# Patient Record
Sex: Female | Born: 1955 | Race: Black or African American | Hispanic: No | Smoking: Former smoker
Health system: Southern US, Community
[De-identification: ages and names within clinical notes are randomized; demographics above are authoritative.]

## PROBLEM LIST (undated history)

## (undated) DIAGNOSIS — Z923 Personal history of irradiation: Secondary | ICD-10-CM

## (undated) DIAGNOSIS — I10 Essential (primary) hypertension: Secondary | ICD-10-CM

## (undated) DIAGNOSIS — M419 Scoliosis, unspecified: Secondary | ICD-10-CM

## (undated) DIAGNOSIS — K219 Gastro-esophageal reflux disease without esophagitis: Secondary | ICD-10-CM

## (undated) DIAGNOSIS — E785 Hyperlipidemia, unspecified: Secondary | ICD-10-CM

## (undated) DIAGNOSIS — C801 Malignant (primary) neoplasm, unspecified: Secondary | ICD-10-CM

## (undated) DIAGNOSIS — K449 Diaphragmatic hernia without obstruction or gangrene: Secondary | ICD-10-CM

## (undated) DIAGNOSIS — K635 Polyp of colon: Secondary | ICD-10-CM

## (undated) DIAGNOSIS — G2581 Restless legs syndrome: Secondary | ICD-10-CM

## (undated) DIAGNOSIS — C50919 Malignant neoplasm of unspecified site of unspecified female breast: Secondary | ICD-10-CM

## (undated) DIAGNOSIS — D649 Anemia, unspecified: Secondary | ICD-10-CM

## (undated) HISTORY — DX: Restless legs syndrome: G25.81

## (undated) HISTORY — DX: Gastro-esophageal reflux disease without esophagitis: K21.9

## (undated) HISTORY — PX: CHOLECYSTECTOMY: SHX55

## (undated) HISTORY — DX: Essential (primary) hypertension: I10

## (undated) HISTORY — PX: ABDOMINAL HYSTERECTOMY: SHX81

## (undated) HISTORY — DX: Malignant (primary) neoplasm, unspecified: C80.1

## (undated) HISTORY — DX: Hyperlipidemia, unspecified: E78.5

## (undated) HISTORY — PX: COLONOSCOPY: SHX174

## (undated) HISTORY — DX: Polyp of colon: K63.5

## (undated) HISTORY — DX: Diaphragmatic hernia without obstruction or gangrene: K44.9

## (undated) HISTORY — PX: TONSILLECTOMY: SUR1361

## (undated) HISTORY — DX: Malignant neoplasm of unspecified site of unspecified female breast: C50.919

## (undated) HISTORY — PX: TUBAL LIGATION: SHX77

## (undated) HISTORY — DX: Anemia, unspecified: D64.9

## (undated) HISTORY — PX: BACK SURGERY: SHX140

---

## 2004-08-20 ENCOUNTER — Inpatient Hospital Stay: Payer: Self-pay | Admitting: Unknown Physician Specialty

## 2007-03-09 ENCOUNTER — Ambulatory Visit: Payer: Self-pay | Admitting: Internal Medicine

## 2008-04-17 ENCOUNTER — Ambulatory Visit: Payer: Self-pay | Admitting: Internal Medicine

## 2009-04-24 ENCOUNTER — Ambulatory Visit: Payer: Self-pay | Admitting: Internal Medicine

## 2010-05-21 ENCOUNTER — Ambulatory Visit: Payer: Self-pay | Admitting: Internal Medicine

## 2010-06-01 ENCOUNTER — Ambulatory Visit: Payer: Self-pay | Admitting: Internal Medicine

## 2011-10-20 ENCOUNTER — Ambulatory Visit: Payer: Self-pay

## 2012-05-11 ENCOUNTER — Ambulatory Visit: Payer: Self-pay | Admitting: Internal Medicine

## 2012-12-12 ENCOUNTER — Ambulatory Visit: Payer: Self-pay | Admitting: Physician Assistant

## 2012-12-29 ENCOUNTER — Ambulatory Visit: Payer: Self-pay | Admitting: Physician Assistant

## 2013-09-20 DIAGNOSIS — C50919 Malignant neoplasm of unspecified site of unspecified female breast: Secondary | ICD-10-CM

## 2013-09-20 HISTORY — PX: BREAST LUMPECTOMY: SHX2

## 2013-09-20 HISTORY — PX: BREAST BIOPSY: SHX20

## 2013-09-20 HISTORY — DX: Malignant neoplasm of unspecified site of unspecified female breast: C50.919

## 2014-04-15 ENCOUNTER — Ambulatory Visit: Payer: Self-pay | Admitting: Internal Medicine

## 2014-04-25 ENCOUNTER — Ambulatory Visit: Payer: Self-pay

## 2014-04-29 ENCOUNTER — Ambulatory Visit: Payer: Self-pay

## 2014-05-02 ENCOUNTER — Ambulatory Visit: Payer: Self-pay

## 2014-05-08 ENCOUNTER — Ambulatory Visit (INDEPENDENT_AMBULATORY_CARE_PROVIDER_SITE_OTHER): Payer: 59 | Admitting: General Surgery

## 2014-05-08 ENCOUNTER — Encounter: Payer: Self-pay | Admitting: General Surgery

## 2014-05-08 VITALS — BP 118/82 | HR 72 | Resp 14 | Ht 60.0 in | Wt 154.0 lb

## 2014-05-08 DIAGNOSIS — D0590 Unspecified type of carcinoma in situ of unspecified breast: Secondary | ICD-10-CM

## 2014-05-08 DIAGNOSIS — D0511 Intraductal carcinoma in situ of right breast: Secondary | ICD-10-CM | POA: Insufficient documentation

## 2014-05-08 NOTE — Progress Notes (Signed)
Patient ID: Patricia Le, female   DOB: 10-06-1955, 58 y.o.   MRN: [id number]  Chief Complaint  Patient presents with  . Other    mammogram    HPI Patricia Le is a 58 y.o. female who presents for a breast evaluation. The most recent mammogram was done on 04/29/14 and biopsy on 05/02/14.  Patient does perform regular self breast checks and gets regular mammograms done. Biopsy showed DCIS.  No family history of breast cancer.  HPI  Past Medical History  Diagnosis Date  . Anemia   . GERD (gastroesophageal reflux disease)   . Hypertension   . Colon polyp   . Hyperlipidemia   . Hiatal hernia     Past Surgical History  Procedure Laterality Date  . Abdominal hysterectomy    . Tonsillectomy    . Cesarean section    . Colonoscopy      over 10 yrs ago/ Dr Kristopher Glee  . Tubal ligation    . Cholecystectomy    . Back surgery      Family History  Problem Relation Age of Onset  . Cancer Father 76    melanoma  . Diabetes Mother     Social History History  Substance Use Topics  . Smoking status: Former Smoker -- 1.00 packs/day for 20 years    Types: Cigarettes  . Smokeless tobacco: Not on file  . Alcohol Use: Yes    No Known Allergies  Current Outpatient Prescriptions  Medication Sig Dispense Refill  . gabapentin (NEURONTIN) 300 MG capsule Take 300 mg by mouth 3 (three) times daily.       Marland Kitchen ketoconazole (NIZORAL) 2 % cream Apply 1 application topically 2 (two) times daily.       . meloxicam (MOBIC) 7.5 MG tablet Take 7.5 mg by mouth as needed for pain.      . metoprolol tartrate (LOPRESSOR) 25 MG tablet Take 25 mg by mouth 2 (two) times daily.       Marland Kitchen omeprazole (PRILOSEC) 40 MG capsule Take 40 mg by mouth daily.       . ondansetron (ZOFRAN) 8 MG tablet Take 8 mg by mouth every 8 (eight) hours as needed.       . phentermine 37.5 MG capsule Take 37.5 mg by mouth every morning.      Marland Kitchen PREMARIN 0.9 MG tablet Take 0.9 mg by mouth daily.        No current  facility-administered medications for this visit.    Review of Systems Review of Systems  Constitutional: Negative.   Respiratory: Negative.   Cardiovascular: Negative.     Blood pressure 118/82, pulse 72, resp. rate 14, height 5' (1.524 m), weight 154 lb (69.854 kg).  Physical Exam Physical Exam  Constitutional: She is oriented to person, place, and time. She appears well-developed and well-nourished.  Eyes: Conjunctivae are normal. No scleral icterus.  Neck: Neck supple.  Cardiovascular: Normal rate, regular rhythm and normal heart sounds.   Pulmonary/Chest: Effort normal and breath sounds normal. Right breast exhibits tenderness. Right breast exhibits no inverted nipple, no mass, no nipple discharge and no skin change. Left breast exhibits no inverted nipple, no mass, no nipple discharge, no skin change and no tenderness.  Biopsy site right breast upper outer quadrant near axillary tail  Abdominal: Soft. There is no tenderness.  Lymphadenopathy:    She has no cervical adenopathy.    She has no axillary adenopathy.  Neurological: She is alert and oriented to person, place,  and time.  Skin: Skin is warm and dry.    Data Reviewed Mammogram reviewed. Cluster of micro calcifications in uoq right breast  Assessment    DCIS right breast. Intermediate grade. ER/PR to be done on lumpectomy specimen.    Plan    Discussed  right breast lumpectomy. Procedure, risks and benefits other options discussed. Also explained need for subsequent radiation, possible finding of invasive CA and need for node biopsy. Pt is agreeable.    Patient's surgery has been scheduled for 06-06-14 at [facility]. This patient already has a cruise planned for 05-30-14 through 06-03-14 and this will allow her to still make her trip as previously planned.    Vani Gunner G 05/09/2014, 7:51 AM

## 2014-05-08 NOTE — Patient Instructions (Addendum)
Call office for any new breast issues or concerns.     Patient's surgery has been scheduled for 05-21-14 at [facility].

## 2014-05-09 ENCOUNTER — Encounter: Payer: Self-pay | Admitting: General Surgery

## 2014-05-21 ENCOUNTER — Other Ambulatory Visit: Payer: Self-pay | Admitting: General Surgery

## 2014-05-21 ENCOUNTER — Ambulatory Visit: Payer: Self-pay | Admitting: Oncology

## 2014-05-21 DIAGNOSIS — D0511 Intraductal carcinoma in situ of right breast: Secondary | ICD-10-CM

## 2014-05-23 LAB — CA 125: CA 125: 9.4 U/mL (ref 0.0–34.0)

## 2014-05-29 ENCOUNTER — Ambulatory Visit: Payer: Self-pay | Admitting: Anesthesiology

## 2014-06-03 ENCOUNTER — Ambulatory Visit (INDEPENDENT_AMBULATORY_CARE_PROVIDER_SITE_OTHER): Payer: 59 | Admitting: General Surgery

## 2014-06-03 ENCOUNTER — Encounter: Payer: Self-pay | Admitting: General Surgery

## 2014-06-03 VITALS — BP 130/74 | HR 74 | Resp 12 | Ht 62.0 in | Wt 162.0 lb

## 2014-06-03 DIAGNOSIS — D0511 Intraductal carcinoma in situ of right breast: Secondary | ICD-10-CM

## 2014-06-03 DIAGNOSIS — D0590 Unspecified type of carcinoma in situ of unspecified breast: Secondary | ICD-10-CM

## 2014-06-03 NOTE — Progress Notes (Signed)
Patricia Le is a 58 y.o. female here today for her pre op right breast lumpectomy scheduled for 9/17/5.  A quick look with US shows the prior biopsy cavity and clip in far upper lateral right breast. Procedure explained again to her.

## 2014-06-03 NOTE — Progress Notes (Deleted)
Patient ID: Patricia Le, female   DOB: [birth year], 58 y.o.   MRN: [id number]  Chief Complaint  Patient presents with  . Pre-op Exam    right breast lumpectomy    HPI [name] is a 58 y.o. female HPI  Past Medical History  Diagnosis Date  . Anemia   . GERD (gastroesophageal reflux disease)   . Hypertension   . Colon polyp   . Hyperlipidemia   . Hiatal hernia     Past Surgical History  Procedure Laterality Date  . Abdominal hysterectomy    . Tonsillectomy    . Cesarean section    . Colonoscopy      over 10 yrs ago/ Dr Kristopher Glee  . Tubal ligation    . Cholecystectomy    . Back surgery      Family History  Problem Relation Age of Onset  . Cancer Father 74    melanoma  . Diabetes Mother     Social History History  Substance Use Topics  . Smoking status: Former Smoker -- 1.00 packs/day for 20 years    Types: Cigarettes  . Smokeless tobacco: Not on file  . Alcohol Use: Yes    No Known Allergies  Current Outpatient Prescriptions  Medication Sig Dispense Refill  . gabapentin (NEURONTIN) 300 MG capsule Take 300 mg by mouth 3 (three) times daily.       Marland Kitchen ketoconazole (NIZORAL) 2 % cream Apply 1 application topically 2 (two) times daily.       . meloxicam (MOBIC) 7.5 MG tablet Take 7.5 mg by mouth as needed for pain.      . metoprolol tartrate (LOPRESSOR) 25 MG tablet Take 25 mg by mouth 2 (two) times daily.       Marland Kitchen omeprazole (PRILOSEC) 40 MG capsule Take 40 mg by mouth daily.       . ondansetron (ZOFRAN) 8 MG tablet Take 8 mg by mouth every 8 (eight) hours as needed.       . phentermine 37.5 MG capsule Take 37.5 mg by mouth every morning.      Marland Kitchen PREMARIN 0.9 MG tablet Take 0.9 mg by mouth daily.        No current facility-administered medications for this visit.    Review of Systems Review of Systems  Blood pressure 130/74, pulse 74, resp. rate 12, height 5\' 2"  (1.575 m), weight 162 lb (73.483 kg).  Physical Exam Physical Exam  Data  Reviewed ***  Assessment    ***    Plan    ***       Gaspar Cola 06/03/2014, 4:40 PM

## 2014-06-03 NOTE — Patient Instructions (Signed)
Scheduled for surgery on 9/17/5.

## 2014-06-04 ENCOUNTER — Encounter: Payer: Self-pay | Admitting: General Surgery

## 2014-06-06 ENCOUNTER — Ambulatory Visit: Payer: Self-pay | Admitting: General Surgery

## 2014-06-06 DIAGNOSIS — D0590 Unspecified type of carcinoma in situ of unspecified breast: Secondary | ICD-10-CM

## 2014-06-06 HISTORY — PX: BREAST SURGERY: SHX581

## 2014-06-07 LAB — PATHOLOGY REPORT

## 2014-06-11 ENCOUNTER — Encounter: Payer: Self-pay | Admitting: General Surgery

## 2014-06-12 ENCOUNTER — Telehealth: Payer: Self-pay | Admitting: *Deleted

## 2014-06-12 ENCOUNTER — Encounter: Payer: Self-pay | Admitting: General Surgery

## 2014-06-12 NOTE — Telephone Encounter (Signed)
Notified patient that the most recent surgery showed the margins were clear per Dr. Jamal Collin. Patient pleased and reminded of follow up next week, pt agrees. States she is doing well.

## 2014-06-17 LAB — COMPREHENSIVE METABOLIC PANEL
ALBUMIN: 3.8 g/dL (ref 3.4–5.0)
ALT: 22 U/L
Alkaline Phosphatase: 71 U/L
Anion Gap: 6 — ABNORMAL LOW (ref 7–16)
BILIRUBIN TOTAL: 0.4 mg/dL (ref 0.2–1.0)
BUN: 9 mg/dL (ref 7–18)
CREATININE: 0.72 mg/dL (ref 0.60–1.30)
Calcium, Total: 9.2 mg/dL (ref 8.5–10.1)
Chloride: 99 mmol/L (ref 98–107)
Co2: 31 mmol/L (ref 21–32)
EGFR (Non-African Amer.): >60
GLUCOSE: 104 mg/dL — AB (ref 65–99)
Osmolality: 271 (ref 275–301)
Potassium: 3.7 mmol/L (ref 3.5–5.1)
SGOT(AST): 14 U/L — ABNORMAL LOW (ref 15–37)
SODIUM: 136 mmol/L (ref 136–145)
Total Protein: 6.9 g/dL (ref 6.4–8.2)

## 2014-06-17 LAB — CBC CANCER CENTER
BASOS ABS: 0.1 x10 3/mm (ref 0.0–0.1)
Basophil %: 1.2 %
EOS PCT: 6.3 %
Eosinophil #: 0.4 x10 3/mm (ref 0.0–0.7)
HCT: 41.6 % (ref 35.0–47.0)
HGB: 13.8 g/dL (ref 12.0–16.0)
Lymphocyte #: 3 x10 3/mm (ref 1.0–3.6)
Lymphocyte %: 44.9 %
MCH: 29.8 pg (ref 26.0–34.0)
MCHC: 33.2 g/dL (ref 32.0–36.0)
MCV: 90 fL (ref 80–100)
MONO ABS: 0.6 x10 3/mm (ref 0.2–0.9)
Monocyte %: 9.4 %
Neutrophil #: 2.5 x10 3/mm (ref 1.4–6.5)
Neutrophil %: 38.2 %
Platelet: 282 x10 3/mm (ref 150–440)
RBC: 4.63 10*6/uL (ref 3.80–5.20)
RDW: 12.9 % (ref 11.5–14.5)
WBC: 6.6 x10 3/mm (ref 3.6–11.0)

## 2014-06-18 ENCOUNTER — Ambulatory Visit (INDEPENDENT_AMBULATORY_CARE_PROVIDER_SITE_OTHER): Payer: Self-pay | Admitting: General Surgery

## 2014-06-18 ENCOUNTER — Telehealth: Payer: Self-pay | Admitting: *Deleted

## 2014-06-18 ENCOUNTER — Encounter: Payer: Self-pay | Admitting: General Surgery

## 2014-06-18 VITALS — BP 120/80 | HR 78 | Resp 12 | Ht 62.0 in | Wt 164.0 lb

## 2014-06-18 DIAGNOSIS — D0590 Unspecified type of carcinoma in situ of unspecified breast: Secondary | ICD-10-CM

## 2014-06-18 DIAGNOSIS — D0511 Intraductal carcinoma in situ of right breast: Secondary | ICD-10-CM

## 2014-06-18 MED ORDER — CEFADROXIL 500 MG PO CAPS: 500.0000 mg | ORAL_CAPSULE | Freq: Two times a day (BID) | ORAL | Status: DC

## 2014-06-18 NOTE — Progress Notes (Signed)
This is a 58 year old female here today for her post op right breast lumpectomy done on 06/06/14. Patient states she is doing well.  Right breast lumpectomy site looks clean and healing well.   Path showed no residula DCIS. ER/PR are pending Based on intraop assessment with CED pt is suitable for mammosite. Pt was seen by Dr. Baruch Gouty yesterday. I  discussed with him and pt about mammosite.  Pt is agreeable. On [place] assessment the skin margin to cavity  is 1.3cm

## 2014-06-18 NOTE — Telephone Encounter (Signed)
Mammosite schedule reviewed with the patient Placement   07-02-14 at 1:00   at [facility] Scan 07-04-14 Treat 10-16 and 10-19 to [place] will be calling her for more details Aware of ATB and directions reviewed. Aware no showers and to wear her bra while mammosite in place. Pt agrees.

## 2014-06-18 NOTE — Patient Instructions (Signed)
Follow up appointment to be announced.  

## 2014-06-20 ENCOUNTER — Ambulatory Visit: Payer: Self-pay | Admitting: Oncology

## 2014-07-02 ENCOUNTER — Ambulatory Visit (INDEPENDENT_AMBULATORY_CARE_PROVIDER_SITE_OTHER): Payer: 59 | Admitting: General Surgery

## 2014-07-02 ENCOUNTER — Ambulatory Visit: Payer: 59

## 2014-07-02 ENCOUNTER — Encounter: Payer: Self-pay | Admitting: General Surgery

## 2014-07-02 VITALS — BP 144/72 | HR 80 | Resp 14 | Ht 62.0 in | Wt 161.0 lb

## 2014-07-02 DIAGNOSIS — C50911 Malignant neoplasm of unspecified site of right female breast: Secondary | ICD-10-CM

## 2014-07-02 HISTORY — PX: BREAST MAMMOSITE: SHX5264

## 2014-07-02 NOTE — Progress Notes (Signed)
Here today for right breast mammosite placement. Procedure completed without any problems.  See full note on imaging report. Skin margin is 0.75 to 0.82 cm F/ after radiation completed.

## 2014-07-02 NOTE — Patient Instructions (Signed)
Patient care kit given to patient.  Instructed no showers, sponge bath while mammosite in place, take antibiotic. Follow up with [facility] as arranged. Discussed wearing your bra for support at all times. 

## 2014-07-05 ENCOUNTER — Other Ambulatory Visit: Payer: Self-pay

## 2014-07-21 ENCOUNTER — Ambulatory Visit: Payer: Self-pay | Admitting: Oncology

## 2014-07-22 ENCOUNTER — Encounter: Payer: Self-pay | Admitting: General Surgery

## 2014-07-23 ENCOUNTER — Encounter: Payer: Self-pay | Admitting: General Surgery

## 2014-07-23 ENCOUNTER — Ambulatory Visit (INDEPENDENT_AMBULATORY_CARE_PROVIDER_SITE_OTHER): Payer: Self-pay | Admitting: General Surgery

## 2014-07-23 VITALS — BP 122/74 | HR 88 | Resp 14 | Ht 60.0 in | Wt 161.0 lb

## 2014-07-23 DIAGNOSIS — D0511 Intraductal carcinoma in situ of right breast: Secondary | ICD-10-CM

## 2014-07-23 MED ORDER — LETROZOLE 2.5 MG PO TABS: 2.5000 mg | ORAL_TABLET | Freq: Every day | ORAL | Status: DC

## 2014-07-23 NOTE — Patient Instructions (Addendum)
Continue self breast exams. Call office for any new breast issues or concerns. Follow up in February 2016 with right diagnostic mammogram and office visit. Letrozole tablets What is this medicine? LETROZOLE (LET roe zole) blocks the production of estrogen. Certain types of breast cancer grow under the influence of estrogen. Letrozole helps block tumor growth. This medicine is used to treat advanced breast cancer in postmenopausal women. This medicine may be used for other purposes; ask your health care provider or pharmacist if you have questions. COMMON BRAND NAME(S): Femara What should I tell my health care provider before I take this medicine? They need to know if you have any of these conditions: -liver disease -osteoporosis (weak bones) -an unusual or allergic reaction to letrozole, other medicines, foods, dyes, or preservatives -pregnant or trying to get pregnant -breast-feeding How should I use this medicine? Take this medicine by mouth with a glass of water. You may take it with or without food. Follow the directions on the prescription label. Take your medicine at regular intervals. Do not take your medicine more often than directed. Do not stop taking except on your doctor's advice. Talk to your pediatrician regarding the use of this medicine in children. Special care may be needed. Overdosage: If you think you have taken too much of this medicine contact a poison control center or emergency room at once. NOTE: This medicine is only for you. Do not share this medicine with others. What if I miss a dose? If you miss a dose, take it as soon as you can. If it is almost time for your next dose, take only that dose. Do not take double or extra doses. What may interact with this medicine? Do not take this medicine with any of the following medications: -estrogens, like hormone replacement therapy or birth control pills This medicine may also interact with the following  medications: -dietary supplements such as androstenedione or DHEA -prasterone -tamoxifen This list may not describe all possible interactions. Give your health care provider a list of all the medicines, herbs, non-prescription drugs, or dietary supplements you use. Also tell them if you smoke, drink alcohol, or use illegal drugs. Some items may interact with your medicine. What should I watch for while using this medicine? Visit your doctor or health care professional for regular check-ups to monitor your condition. Do not use this drug if you are pregnant. Serious side effects to an unborn child are possible. Talk to your doctor or pharmacist for more information. You may get drowsy or dizzy. Do not drive, use machinery, or do anything that needs mental alertness until you know how this medicine affects you. Do not stand or sit up quickly, especially if you are an older patient. This reduces the risk of dizzy or fainting spells. What side effects may I notice from receiving this medicine? Side effects that you should report to your doctor or health care professional as soon as possible: -allergic reactions like skin rash, itching, or hives -bone fracture -chest pain -difficulty breathing or shortness of breath -severe pain, swelling, warmth in the leg -unusually weak or tired -vaginal bleeding Side effects that usually do not require medical attention (report to your doctor or health care professional if they continue or are bothersome): -bone, back, joint, or muscle pain -dizziness -fatigue -fluid retention -headache -hot flashes, night sweats -nausea -weight gain This list may not describe all possible side effects. Call your doctor for medical advice about side effects. You may report side effects to FDA  at 1-800-FDA-1088. Where should I keep my medicine? Keep out of the reach of children. Store between 15 and 30 degrees C (59 and 86 degrees F). Throw away any unused medicine after  the expiration date. NOTE: This sheet is a summary. It may not cover all possible information. If you have questions about this medicine, talk to your doctor, pharmacist, or health care provider.  2015, Elsevier/Gold Standard. (2007-11-17 16:43:44)

## 2014-07-23 NOTE — Progress Notes (Signed)
Here today for follow up from right breast mammosite. States she is doing well.  She completed the radiation on 07-11-14. Appointment with Dr Oliva Bustard is January 2016.  Lumpectomy site well healed, no evidence of seroma.   RX sent for Letrozole 2.5 mg daily.  Follow up in February 2016 with right diagnostic mammogram and office visit.

## 2014-07-24 LAB — PATHOLOGY REPORT

## 2014-07-25 ENCOUNTER — Telehealth: Payer: Self-pay | Admitting: *Deleted

## 2014-07-25 NOTE — Telephone Encounter (Signed)
She called to say that she needs a RX sent to [facility] for her letrozole #30 with 12 refills. RX Faxed to [phone].

## 2014-08-09 ENCOUNTER — Telehealth: Payer: Self-pay | Admitting: General Surgery

## 2014-08-09 NOTE — Telephone Encounter (Signed)
08-09-14 @ 12:37PM PT CALLED & STATED SHE HAD SPOKE WITH MARSHA EARLIER ABOUT HER MEDICATION.SHE NEEDS  MARSHA TO CALL [facility] @ [phone] TO CONFIRM WE ARE ORDERING LETROZOLE FOR HER.MTH

## 2014-08-13 NOTE — Telephone Encounter (Signed)
RX faxed

## 2014-08-22 ENCOUNTER — Ambulatory Visit: Payer: Self-pay | Admitting: Oncology

## 2014-11-13 ENCOUNTER — Encounter: Payer: Self-pay | Admitting: General Surgery

## 2014-11-13 ENCOUNTER — Ambulatory Visit (INDEPENDENT_AMBULATORY_CARE_PROVIDER_SITE_OTHER): Payer: 59 | Admitting: General Surgery

## 2014-11-13 VITALS — BP 112/70 | HR 86 | Resp 14 | Ht 60.0 in | Wt 165.0 lb

## 2014-11-13 DIAGNOSIS — D0511 Intraductal carcinoma in situ of right breast: Secondary | ICD-10-CM

## 2014-11-13 DIAGNOSIS — Z1211 Encounter for screening for malignant neoplasm of colon: Secondary | ICD-10-CM

## 2014-11-13 MED ORDER — POLYETHYLENE GLYCOL 3350 17 GM/SCOOP PO POWD: ORAL | Status: DC

## 2014-11-13 NOTE — Patient Instructions (Addendum)
Continue self breast exams. Call office for any new breast issues or concerns.  Colonoscopy A colonoscopy is an exam to look at the entire large intestine (colon). This exam can help find problems such as tumors, polyps, inflammation, and areas of bleeding. The exam takes about 1 hour.  LET [facility] CARE PROVIDER KNOW ABOUT:   Any allergies you have.  All medicines you are taking, including vitamins, herbs, eye drops, creams, and over-the-counter medicines.  Previous problems you or members of your family have had with the use of anesthetics.  Any blood disorders you have.  Previous surgeries you have had.  Medical conditions you have. RISKS AND COMPLICATIONS  Generally, this is a safe procedure. However, as with any procedure, complications can occur. Possible complications include:  Bleeding.  Tearing or rupture of the colon wall.  Reaction to medicines given during the exam.  Infection (rare). BEFORE THE PROCEDURE   Ask your health care provider about changing or stopping your regular medicines.  You may be prescribed an oral bowel prep. This involves drinking a large amount of medicated liquid, starting the day before your procedure. The liquid will cause you to have multiple loose stools until your stool is almost clear or light green. This cleans out your colon in preparation for the procedure.  Do not eat or drink anything else once you have started the bowel prep, unless your health care provider tells you it is safe to do so.  Arrange for someone to drive you home after the procedure. PROCEDURE   You will be given medicine to help you relax (sedative).  You will lie on your side with your knees bent.  A long, flexible tube with a light and camera on the end (colonoscope) will be inserted through the rectum and into the colon. The camera sends video back to a computer screen as it moves through the colon. The colonoscope also releases carbon dioxide gas to inflate  the colon. This helps your health care provider see the area better.  During the exam, your health care provider may take a small tissue sample (biopsy) to be examined under a microscope if any abnormalities are found.  The exam is finished when the entire colon has been viewed. AFTER THE PROCEDURE   Do not drive for 24 hours after the exam.  You may have a small amount of blood in your stool.  You may pass moderate amounts of gas and have mild abdominal cramping or bloating. This is caused by the gas used to inflate your colon during the exam.  Ask when your test results will be ready and how you will get your results. Make sure you get your test results. Document Released: 09/03/2000 Document Revised: 06/27/2013 Document Reviewed: 05/14/2013 [facility] Patient Information 2015 [place], [place]. This information is not intended to replace advice given to you by your health care provider. Make sure you discuss any questions you have with your health care provider.  Patient has been scheduled for a colonoscopy on 11-20-14 at [facility].

## 2014-11-13 NOTE — Progress Notes (Signed)
Patient ID: Patricia Le, female   DOB: 02-02-1956, 59 y.o.   MRN: [id number]  Chief Complaint  Patient presents with  . Follow-up    HPI Patricia Le is a 59 y.o. female.  who presents for her follo wup right mammogram and breast evaluation. The most recent right mammogram was done on 11-05-14.  Patient does perform regular self breast checks and gets regular mammograms done.  No new breast issues.  HPI  Past Medical History  Diagnosis Date  . Anemia   . GERD (gastroesophageal reflux disease)   . Hypertension   . Colon polyp   . Hyperlipidemia   . Hiatal hernia   . Cancer     DCIS right breast    Past Surgical History  Procedure Laterality Date  . Abdominal hysterectomy    . Tonsillectomy    . Cesarean section    . Colonoscopy      over 10 yrs ago/ Dr Kristopher Glee  . Tubal ligation    . Cholecystectomy    . Back surgery    . Breast mammosite Right 07-02-14  . Breast surgery Right 06-06-14    Wide excision of Right breast DCIS    Family History  Problem Relation Age of Onset  . Cancer Father 13    melanoma  . Diabetes Mother     Social History History  Substance Use Topics  . Smoking status: Former Smoker -- 1.00 packs/day for 20 years    Types: Cigarettes  . Smokeless tobacco: Not on file  . Alcohol Use: Yes    No Known Allergies  Current Outpatient Prescriptions  Medication Sig Dispense Refill  . gabapentin (NEURONTIN) 300 MG capsule Take 300 mg by mouth 3 (three) times daily.     Marland Kitchen ketoconazole (NIZORAL) 2 % cream Apply 1 application topically 2 (two) times daily.     Marland Kitchen letrozole (FEMARA) 2.5 MG tablet Take 1 tablet (2.5 mg total) by mouth daily. 30 tablet 12  . meloxicam (MOBIC) 7.5 MG tablet Take 7.5 mg by mouth as needed for pain.    . metoprolol tartrate (LOPRESSOR) 25 MG tablet Take 25 mg by mouth 2 (two) times daily.     Marland Kitchen omeprazole (PRILOSEC) 40 MG capsule Take 40 mg by mouth daily.     . ondansetron (ZOFRAN) 8 MG tablet Take 8 mg by mouth every  8 (eight) hours as needed.     Marland Kitchen PARoxetine (PAXIL) 20 MG tablet Take 20 mg by mouth daily.   2   No current facility-administered medications for this visit.    Review of Systems Review of Systems  Constitutional: Negative.   Respiratory: Negative.   Cardiovascular: Negative.     Blood pressure 112/70, pulse 86, resp. rate 14, height 5' (1.524 m), weight 165 lb (74.844 kg).  Physical Exam Physical Exam  Constitutional: She is oriented to person, place, and time. She appears well-developed and well-nourished.  Eyes: Conjunctivae are normal. No scleral icterus.  Neck: Neck supple.  Cardiovascular: Normal rate, regular rhythm and normal heart sounds.   Pulmonary/Chest: Effort normal and breath sounds normal. Right breast exhibits no inverted nipple, no mass, no nipple discharge, no skin change and no tenderness. Left breast exhibits no inverted nipple, no mass, no nipple discharge, no skin change and no tenderness.  Well healed lumpectomy site upper outer quadrant right breast.  Lymphadenopathy:    She has no cervical adenopathy.    She has no axillary adenopathy.  Neurological: She is alert and  oriented to person, place, and time.  Skin: Skin is warm and dry.    Data Reviewed Mammogram right reviewed and stable.  Assessment    Stable physical exam. 6 mos post right breast lumpectomy and mammosite for DCIS- hormone pos. Currently on Letrazole and doing well save for hot flashes.    Plan    The patient has been asked to return to the office in 6 months with a bilateral diagnostic mammogram.   Colonoscopy with possible biopsy/polypectomy prn: Information regarding the procedure, including its potential risks and complications (including but not limited to perforation of the bowel, which may require emergency surgery to repair, and bleeding) was verbally given to the patient. Educational information regarding lower instestinal endoscopy was given to the patient. Written  instructions for how to complete the bowel prep using Miralax were provided. The importance of drinking ample fluids to avoid dehydration as a result of the prep emphasized.     Patient has been scheduled for a colonoscopy on 11-20-14 at [facility].   SANKAR,SEEPLAPUTHUR G 11/13/2014, 3:53 PM

## 2014-11-20 ENCOUNTER — Ambulatory Visit: Payer: Self-pay | Admitting: General Surgery

## 2014-11-20 DIAGNOSIS — Z1211 Encounter for screening for malignant neoplasm of colon: Secondary | ICD-10-CM | POA: Diagnosis not present

## 2014-11-20 DIAGNOSIS — K621 Rectal polyp: Secondary | ICD-10-CM | POA: Diagnosis not present

## 2014-11-20 DIAGNOSIS — K5730 Diverticulosis of large intestine without perforation or abscess without bleeding: Secondary | ICD-10-CM | POA: Diagnosis not present

## 2014-11-20 HISTORY — PX: COLONOSCOPY: SHX174

## 2014-11-21 ENCOUNTER — Encounter: Payer: Self-pay | Admitting: General Surgery

## 2014-11-22 ENCOUNTER — Encounter: Payer: Self-pay | Admitting: *Deleted

## 2014-11-22 ENCOUNTER — Encounter: Payer: Self-pay | Admitting: General Surgery

## 2014-11-26 ENCOUNTER — Telehealth: Payer: Self-pay | Admitting: *Deleted

## 2014-11-26 NOTE — Telephone Encounter (Signed)
-----   Message from Christene Lye, MD sent at 11/25/2014 10:56 AM EST ----- Please let pt pt know the pathology was normal.

## 2014-11-26 NOTE — Telephone Encounter (Signed)
Notified patient as instructed, patient pleased. Discussed follow-up appointments, patient agrees  

## 2015-01-11 NOTE — Op Note (Signed)
PATIENT NAME:  Patricia Le, Patricia Le MR#:  [id number] DATE OF BIRTH:  1956-04-21  DATE OF PROCEDURE:  06/06/2014  PREOPERATIVE DIAGNOSIS: Ductal carcinoma in situ, right breast.   POSTOPERATIVE DIAGNOSIS: Ductal carcinoma in situ, right breast.   OPERATION:  1.  Localization of a biopsy clip in the right breast with wire using ultrasound guidance. 2.  Right breast lumpectomy.   SURGEON: S.G. Jamal Collin, M.D.   ANESTHESIA: General.   COMPLICATIONS: None.   ESTIMATED BLOOD LOSS: Minimal.   DRAINS: None.   DESCRIPTION OF PROCEDURE: The patient was put to sleep in the supine position on the operating table. The right breast was prepped and draped out as a sterile field. The patient's biopsy site was located in the upper outer quadrant near the axillary tail. Ultrasound probe was brought up to the field. The clip and a small biopsy cavity was identified and, using a small stab incision, a Bard UltraWire was positioned going directly beneath the clip and anchored into position. A curvilinear incision was then made starting from the wire entrance site overlying the area of the biopsy cavity. The skin and subcutaneous tissue were then elevated on both sides and the wire was used as a guide to excise out a small biopsy cavity with what grossly appeared to be good margins. The excised tissue was tagged for margins and sent to x-ray, and x-ray confirmed the presence of the clip within the specimen, and it was then sent to pathology. Hemostasis was obtained with cautery. The wound was irrigated and closed with deeper layers closed with 2-0 Vicryl and 3-0 Vicryl. A separate smaller incision was made about 1 cm inferior and lateral to this main incision and dissected with blunt clamp into the lumpectomy site. A cavity evaluation device and then placed within this cavity with the balloon inflated to 40 mL. Ultrasound was then performed showing that there was an adequate skin margin throughout, close to 1 cm, except for 1  spot in the superiormost aspect where it was about 0.78 cm. It was felt that this would be a suitable cavity and for a Mammosite placement thereafter if this approach for radiation was elected. The CED was then removed after deflating the balloon. The skin incisions were then closed with subcuticular 4-0 Vicryl, covered with Dermabond. The procedure was well tolerated. She was subsequently extubated and returned to the recovery room in stable condition.    ____________________________ S.Robinette Haines, MD sgs:lr D: 06/07/2014 15:09:56 ET T: 06/07/2014 18:41:45 ET JOB#: [id number]  cc: S.G. Jamal Collin, MD, <Dictator> [facility] Robinette Haines MD ELECTRONICALLY SIGNED 06/09/2014 10:10

## 2015-01-11 NOTE — Consult Note (Signed)
Reason for Visit: This 59 year old Female patient presents to the clinic for initial evaluation of  breast cancer .   Referred by Dr. Jamal Collin.  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 Patient underwent stereotactic core biopsy showing ductal carcinoma in situ overall dimension 1.2 cm in greatest dimension grade 2 .Patient 1 wide local excision showed no residual ductal carcinoma in situ. ER PR status is being determined. She has done well postoperatively. She also had a dummy MammoSite catheter placed showing close proximity to the skin although possibility of MammoSite placement was not excluded. She is seen today for consideration of radiation. She specifically denies breast tenderness cough or bone pain.  Past Hx:    Hiatal Hernia:    GERD - Esophageal Reflux:    HTN:    Anemia:    DCIS:    Restless Leg Syndrome:    BTL:    Back surgery:    Colonoscopy:    Cesarean Section:    Hysterectomy:    Cholecystectomy:   Past, Family and Social History:  Past Medical History positive   Cardiovascular hypertension   Gastrointestinal GERD; iatal hernia   Past Surgical History cholecystectomy; hysterectomy, C-section, back surgery, bilateral tubal ligation   Past Medical History Comments restless leg syndrome   Family History positive   Family History Comments father with history of scrotal melanoma also family history of adult onset  diabetes   Social History noncontributory   Additional Past Medical and Surgical History accompanied by daughter today   Allergies:   No Known Allergies:   Home Meds:  Home Medications: Medication Instructions Status  phentermine 37.5 mg oral tablet 1 tab(s) orally once a day Active  omeprazole 40 mg oral delayed release capsule 1 cap(s) orally once a day (in the morning) Active  gabapentin 300 mg oral capsule 1 cap(s) orally 3 times a day Active  ketoconazole topical 2% topical cream Apply topically to affected area once a day, As Needed Active  meloxicam 7.5 mg oral tablet 1 tab(s) orally once a day, As Needed Active  traMADol 50 mg oral tablet 1 tab(s) orally every 6 hours, As Needed - for Pain Active  metoprolol tartrate 25 mg oral tablet 1 tab(s) orally 2 times a day Active   Review of Systems:  General negative   Performance Status (ECOG) 0   Skin negative   Breast see HPI   Ophthalmologic negative   ENMT negative   Respiratory and Thorax negative   Cardiovascular negative   Gastrointestinal negative   Genitourinary negative   Musculoskeletal negative   Neurological negative   Psychiatric negative   Hematology/Lymphatics negative   Endocrine negative   Allergic/Immunologic negative   Review of Systems   denies any weight loss, fatigue, weakness, fever, chills or night sweats. Patient denies any loss of vision, blurred vision. Patient denies any ringing  of the ears or hearing loss. No irregular heartbeat. Patient denies heart murmur or history of fainting. Patient denies any chest pain  or pain radiating to her upper extremities. Patient denies any shortness of breath, difficulty breathing at night, cough or hemoptysis. Patient denies any swelling in the lower legs. Patient denies any nausea vomiting, vomiting of blood, or coffee ground material in the vomitus. Patient denies any stomach pain. Patient states has had normal bowel movements no significant  constipation or diarrhea. Patient denies any dysuria, hematuria or significant nocturia. Patient denies any problems walking, swelling in the joints or loss of balance. Patient denies any skin changes, loss of hair or loss of weight. Patient denies any excessive worrying or anxiety or significant depression. Patient denies any problems with insomnia. Patient denies excessive thirst, polyuria, polydipsia. Patient denies any swollen glands, patient denies easy bruising or easy bleeding. Patient denies any recent infections, allergies or URI. Patient "s visual fields have not changed significantly in recent time.   Physical Exam:  General/Skin/HEENT:  General normal   Skin normal   Eyes normal   ENMT normal   Head and Neck normal   Additional PE well though slightly obese female in NAD. She status post wide local excision of the right breast. Incision is healing well. No dominant mass or nodularity is noted in either breast in 2 positions examined. Lungs are clear to A&P. Abdomen is benign. No axillary or supraclavicular adenopathy is appreciated.   Breasts/Resp/CV/GI/GU:  Respiratory and Thorax normal   Cardiovascular normal   Gastrointestinal normal   Genitourinary normal   MS/Neuro/Psych/Lymph:  Musculoskeletal normal   Neurological normal   Lymphatics normal   Other Results:  Radiology Results: LabUnknown:    10-Aug-15 10:36, Digital Additional Views Rt Breast (SCR)  PACS Image   [facility]:  Digital Additional Views Rt Breast (SCR)   REASON FOR EXAM:    RT BREAST AV FOR CALCS  COMMENTS:       PROCEDURE: MAM - MAM DIG ADDVIEWS RT SCR  - Apr 29 2014 10:36AM     CLINICAL DATA:  Callback from screening mammogram for calcifications  right breast    EXAM:  DIGITAL DIAGNOSTIC  RIGHT MAMMOGRAM    COMPARISON:  April 25, 2014, December 29, 2012, October 20, 2011,  May 21, 2010, April 24, 2009    ACR Breast Density Category b: There are scattered areas  of  fibroglandular density.    FINDINGS:  Spot magnification CC and lateral views of the right breast are  submitted. There is a group of indeterminate microcalcifications in  the upper-outer quadrant right breast.     IMPRESSION:  Suspicious findings.    RECOMMENDATION:  Stereotactic core biopsy right breast calcifications. Patientis  scheduled for biopsy May 02, 2014.    I have discussed the findings and recommendations with the patient.  Results were also provided in writing at the conclusion of the  visit. If applicable, a reminder letter will be sent to the patient  regarding the next appointment.    BI-RADS CATEGORY  4: Suspicious.      Electronically Signed    By: Abelardo Diesel M.D.    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 The transplant delivering up to 5000 cGy to her right breast boosting her scar another 1600 cGy using electron beam. We did discuss the five-day accelerated partial breast radiation although the patient is leaning more towards whole breast radiation. I have set her up for CT simulation later this week. Risks and benefits of treatment including skin reactions, fatigue, possible alteration of blood counts, and inclusion of some superficial lung all were explained in detail to both the patient and her daughter. They both seem to comprehend my treatment plan well. I have ordered CT simulation.  I would like to take this opportunity for allowing me to participate in the care of your patient..  Fax to Physician:  Physicians To Recieve  Fax: Gunnar Fusi, MD - [phone] Lavera Guise, MD - [phone] Janey Greaser, MD - [phone] Christene Lye, MD - [phone].  Electronic Signatures: Yahya Boldman, Roda Shutters (MD)  (Signed 28-Sep-15 12:28)  Authored: HPI, Diagnosis, Past Hx, PFSH, Allergies, Home Meds, ROS, Physical Exam, Other Results, Relevent Results, Encounter Assessment and Plan, Fax to Physician   Last Updated: 28-Sep-15 12:28 by Armstead Peaks (MD)

## 2015-01-13 LAB — SURGICAL PATHOLOGY

## 2015-02-13 ENCOUNTER — Encounter: Payer: Self-pay | Admitting: Radiation Oncology

## 2015-02-13 ENCOUNTER — Ambulatory Visit
Admission: RE | Admit: 2015-02-13 | Discharge: 2015-02-13 | Disposition: A | Discharge status: Home / Self Care | Payer: 59 | Source: Ambulatory Visit | Attending: Radiation Oncology | Admitting: Radiation Oncology

## 2015-02-13 VITALS — BP 126/89 | HR 101 | Temp 97.9°F | Resp 20 | Wt 166.7 lb

## 2015-02-13 DIAGNOSIS — C50911 Malignant neoplasm of unspecified site of right female breast: Secondary | ICD-10-CM

## 2015-02-13 NOTE — Progress Notes (Signed)
Radiation Oncology Follow up Note  Name: Patricia Le   Date:   02/13/2015 MRN:  [id number] DOB: 08-04-1956    This 59 y.o. female presents to the clinic today for follow-up rest cancer status post accelerated partial breast radiation.  REFERRING PROVIDER: Lavera Guise, MD  HPI: Patient is a 59 year old female now out 6 months having completed accelerated partial breast irradiation to her right breast for ductal carcinoma in situ. Tumor was ER/PR positive. She is seen today in routine follow-up and is doing well. She specifically denies breast tenderness cough or bone pain. Is currently on letrozole tolerating that well without side effect. Follow-up mammograms have been fine  COMPLICATIONS OF TREATMENT: none  FOLLOW UP COMPLIANCE: keeps appointments   PHYSICAL EXAM:  BP 126/89 mmHg  Pulse 101  Temp(Src) 97.9 F (36.6 C)  Resp 20  Wt 166 lb 10.7 oz (75.6 kg) Lungs are clear to A&P cardiac examination essentially unremarkable with regular rate and rhythm. No dominant mass or nodularity is noted in either breast in 2 positions examined. Incision is well-healed. No axillary or supraclavicular adenopathy is appreciated. Cosmetic result is excellent. Well-developed well-nourished patient in NAD. HEENT reveals PERLA, EOMI, discs not visualized.  Oral cavity is clear. No oral mucosal lesions are identified. Neck is clear without evidence of cervical or supraclavicular adenopathy. Lungs are clear to A&P. Cardiac examination is essentially unremarkable with regular rate and rhythm without murmur rub or thrill. Abdomen is benign with no organomegaly or masses noted. Motor sensory and DTR levels are equal and symmetric in the upper and lower extremities. Cranial nerves II through XII are grossly intact. Proprioception is intact. No peripheral adenopathy or edema is identified. No motor or sensory levels are noted. Crude visual fields are within normal range.  RADIOLOGY RESULTS: Mammograms are  reviewed  PLAN: At the present time she continues to do well with no evidence of disease. She continues on aromatase inhibitor therapy. I am please were overall progress. I have asked to see her back in 1 year for follow-up. She knows to call sooner with any concerns.  I would like to take this opportunity for allowing me to participate in the care of your patient.Armstead Peaks., MD

## 2015-03-19 ENCOUNTER — Other Ambulatory Visit: Payer: Self-pay

## 2015-03-19 DIAGNOSIS — D0511 Intraductal carcinoma in situ of right breast: Secondary | ICD-10-CM

## 2015-05-15 ENCOUNTER — Ambulatory Visit (INDEPENDENT_AMBULATORY_CARE_PROVIDER_SITE_OTHER): Payer: 59 | Admitting: General Surgery

## 2015-05-15 ENCOUNTER — Encounter: Payer: Self-pay | Admitting: General Surgery

## 2015-05-15 VITALS — BP 108/70 | HR 78 | Resp 18 | Ht 60.0 in | Wt 160.0 lb

## 2015-05-15 DIAGNOSIS — D0511 Intraductal carcinoma in situ of right breast: Secondary | ICD-10-CM

## 2015-05-15 NOTE — Patient Instructions (Addendum)
The patient is aware to call back for any questions or concerns.  Patient will be asked to return to the office in one year with a bilateral diagnostic mammogram.  

## 2015-05-15 NOTE — Progress Notes (Signed)
Patient ID: Patricia Le, female   DOB: 01/14/1956, 59 y.o.   MRN: [id number]  Chief Complaint  Patient presents with  . Follow-up    mammogram    HPI Patricia Le is a 59 y.o. female here for a right breast cancer follow up. Her last mammogram was done on 05/06/15. Patient does perform regular self breast checks and gets regular mammograms done. She has been having a lot of sweating with the use of Femara, but other wise no complaints.  Colonoscopy completed March of 2016 showed one hyperplastic polyp.   HPI  Past Medical History  Diagnosis Date  . Anemia   . GERD (gastroesophageal reflux disease)   . Hypertension   . Colon polyp   . Hyperlipidemia   . Cancer     DCIS right breast  . Breast cancer   . Hiatal hernia   . Restless leg syndrome     Past Surgical History  Procedure Laterality Date  . Abdominal hysterectomy    . Tonsillectomy    . Cesarean section    . Colonoscopy      over 10 yrs ago/ Dr Kristopher Glee  . Tubal ligation    . Cholecystectomy    . Back surgery    . Breast mammosite Right 07-02-14  . Breast surgery Right 06-06-14    Wide excision of Right breast DCIS  . Colonoscopy  11-20-14    Dr Jamal Collin hyperplastic polyp    Family History  Problem Relation Age of Onset  . Cancer Father 25    melanoma  . Diabetes Mother     Social History Social History  Substance Use Topics  . Smoking status: Former Smoker -- 1.00 packs/day for 20 years    Types: Cigarettes  . Smokeless tobacco: Never Used  . Alcohol Use: 0.0 oz/week    0 Standard drinks or equivalent per week    Allergies  Allergen Reactions  . No Known Allergies     Current Outpatient Prescriptions  Medication Sig Dispense Refill  . gabapentin (NEURONTIN) 300 MG capsule Take 300 mg by mouth 3 (three) times daily.     Marland Kitchen letrozole (FEMARA) 2.5 MG tablet Take 1 tablet (2.5 mg total) by mouth daily. 30 tablet 12  . meloxicam (MOBIC) 7.5 MG tablet Take 7.5 mg by mouth as needed for pain.    .  metoprolol tartrate (LOPRESSOR) 25 MG tablet Take 25 mg by mouth 2 (two) times daily.     Marland Kitchen omeprazole (PRILOSEC) 40 MG capsule Take 40 mg by mouth daily.     . phentermine (ADIPEX-P) 37.5 MG tablet   0  . Vitamin D, Ergocalciferol, (DRISDOL) 50000 UNITS CAPS capsule Take 1 capsule by mouth every 7 (seven) days.      No current facility-administered medications for this visit.    Review of Systems Review of Systems  Constitutional: Negative.   Respiratory: Negative.   Cardiovascular: Negative.     Blood pressure 108/70, pulse 78, resp. rate 18, height 5' (1.524 m), weight 160 lb (72.576 kg).  Physical Exam Physical Exam  Constitutional: She is oriented to person, place, and time. She appears well-developed and well-nourished.  HENT:  Mouth/Throat: Oropharynx is clear and moist.  Eyes: Conjunctivae are normal. No scleral icterus.  Neck: Neck supple.  Cardiovascular: Normal rate, regular rhythm and normal heart sounds.   Pulmonary/Chest: Effort normal and breath sounds normal. Right breast exhibits no inverted nipple, no mass, no nipple discharge, no skin change and no tenderness. Left  breast exhibits no inverted nipple, no mass, no nipple discharge, no skin change and no tenderness.  Incision well healed right breast, no palpable findings.  Abdominal: Soft. There is no tenderness.  Lymphadenopathy:    She has no cervical adenopathy.    She has no axillary adenopathy.  Neurological: She is alert and oriented to person, place, and time.  Skin: Skin is warm and dry.  Psychiatric: Her behavior is normal.    Data Reviewed Mammogram reviewed and stable.  Assessment    Stable physical exam. 11 months post right breast lumpectomy and mammosite for DCIS- hormone pos. Currently on Letrazole and doing well        Plan    Patient will be asked to return to the office in one year with a bilateral diagnostic mammogram.      PCP: Narda Amber 05/15/2015,  11:32 AM

## 2015-05-28 ENCOUNTER — Emergency Department
Admission: EM | Admit: 2015-05-28 | Discharge: 2015-05-28 | Disposition: A | Discharge status: Home / Self Care | Payer: 59 | Attending: Emergency Medicine | Admitting: Emergency Medicine

## 2015-05-28 ENCOUNTER — Encounter: Payer: Self-pay | Admitting: Medical Oncology

## 2015-05-28 DIAGNOSIS — K529 Noninfective gastroenteritis and colitis, unspecified: Secondary | ICD-10-CM | POA: Diagnosis not present

## 2015-05-28 DIAGNOSIS — I10 Essential (primary) hypertension: Secondary | ICD-10-CM | POA: Diagnosis not present

## 2015-05-28 DIAGNOSIS — Z79899 Other long term (current) drug therapy: Secondary | ICD-10-CM | POA: Insufficient documentation

## 2015-05-28 DIAGNOSIS — Z87891 Personal history of nicotine dependence: Secondary | ICD-10-CM | POA: Insufficient documentation

## 2015-05-28 DIAGNOSIS — R197 Diarrhea, unspecified: Secondary | ICD-10-CM | POA: Diagnosis present

## 2015-05-28 LAB — COMPREHENSIVE METABOLIC PANEL
ALBUMIN: 5.2 g/dL — AB (ref 3.5–5.0)
ALT: 28 U/L (ref 14–54)
AST: 35 U/L (ref 15–41)
Alkaline Phosphatase: 82 U/L (ref 38–126)
Anion gap: 14 (ref 5–15)
BILIRUBIN TOTAL: 1 mg/dL (ref 0.3–1.2)
BUN: 19 mg/dL (ref 6–20)
CHLORIDE: 105 mmol/L (ref 101–111)
CO2: 24 mmol/L (ref 22–32)
Calcium: 10.6 mg/dL — ABNORMAL HIGH (ref 8.9–10.3)
Creatinine, Ser: 0.87 mg/dL (ref 0.44–1.00)
GFR calc Af Amer: >60 mL/min (ref >60)
GFR calc non Af Amer: >60 mL/min (ref >60)
GLUCOSE: 155 mg/dL — AB (ref 65–99)
POTASSIUM: 3.6 mmol/L (ref 3.5–5.1)
Sodium: 143 mmol/L (ref 135–145)
TOTAL PROTEIN: 8.4 g/dL — AB (ref 6.5–8.1)

## 2015-05-28 LAB — LIPASE, BLOOD: Lipase: 21 U/L — ABNORMAL LOW (ref 22–51)

## 2015-05-28 LAB — URINALYSIS COMPLETE WITH MICROSCOPIC (ARMC ONLY)
Bilirubin Urine: NEGATIVE
Glucose, UA: NEGATIVE mg/dL
Hgb urine dipstick: NEGATIVE
Leukocytes, UA: NEGATIVE
Nitrite: NEGATIVE
PROTEIN: 100 mg/dL — AB
Specific Gravity, Urine: 1.03 (ref 1.005–1.030)
pH: 6 (ref 5.0–8.0)

## 2015-05-28 LAB — CBC
HEMATOCRIT: 46.4 % (ref 35.0–47.0)
Hemoglobin: 16.2 g/dL — ABNORMAL HIGH (ref 12.0–16.0)
MCH: 29.8 pg (ref 26.0–34.0)
MCHC: 34.9 g/dL (ref 32.0–36.0)
MCV: 85.5 fL (ref 80.0–100.0)
Platelets: 314 10*3/uL (ref 150–440)
RBC: 5.43 MIL/uL — ABNORMAL HIGH (ref 3.80–5.20)
RDW: 13.4 % (ref 11.5–14.5)
WBC: 11.9 10*3/uL — ABNORMAL HIGH (ref 3.6–11.0)

## 2015-05-28 MED ORDER — ONDANSETRON HCL 4 MG/2ML IJ SOLN
4.0000 mg | Freq: Once | INTRAMUSCULAR | Status: AC
  Administered 2015-05-28: 4 mg via INTRAVENOUS
  Filled 2015-05-28: qty 2

## 2015-05-28 MED ORDER — SODIUM CHLORIDE 0.9 % IV BOLUS (SEPSIS)
500.0000 mL | Freq: Once | INTRAVENOUS | Status: AC
  Administered 2015-05-28: 500 mL via INTRAVENOUS

## 2015-05-28 MED ORDER — ONDANSETRON HCL 4 MG PO TABS: 4.0000 mg | ORAL_TABLET | Freq: Three times a day (TID) | ORAL | Status: DC | PRN

## 2015-05-28 NOTE — ED Notes (Signed)
Pt from home via ems with reports that she began having diarrhea and vomiting since last night. Pt denies abd pain. Has hx of diverticulitis.

## 2015-05-28 NOTE — Discharge Instructions (Signed)
Viral Gastroenteritis °Viral gastroenteritis is also known as stomach flu. This condition affects the stomach and intestinal tract. It can cause sudden diarrhea and vomiting. The illness typically lasts 3 to 8 days. Most people develop an immune response that eventually gets rid of the virus. While this natural response develops, the virus can make you quite ill. °CAUSES  °Many different viruses can cause gastroenteritis, such as rotavirus or noroviruses. You can catch one of these viruses by consuming contaminated food or water. You may also catch a virus by sharing utensils or other personal items with an infected person or by touching a contaminated surface. °SYMPTOMS  °The most common symptoms are diarrhea and vomiting. These problems can cause a severe loss of body fluids (dehydration) and a body salt (electrolyte) imbalance. Other symptoms may include: °· Fever. °· Headache. °· Fatigue. °· Abdominal pain. °DIAGNOSIS  °Your caregiver can usually diagnose viral gastroenteritis based on your symptoms and a physical exam. A stool sample may also be taken to test for the presence of viruses or other infections. °TREATMENT  °This illness typically goes away on its own. Treatments are aimed at rehydration. The most serious cases of viral gastroenteritis involve vomiting so severely that you are not able to keep fluids down. In these cases, fluids must be given through an intravenous line (IV). °HOME CARE INSTRUCTIONS  °· Drink enough fluids to keep your urine clear or pale yellow. Drink small amounts of fluids frequently and increase the amounts as tolerated. °· Ask your caregiver for specific rehydration instructions. °· Avoid: °¨ Foods high in sugar. °¨ Alcohol. °¨ Carbonated drinks. °¨ Tobacco. °¨ Juice. °¨ Caffeine drinks. °¨ Extremely hot or cold fluids. °¨ Fatty, greasy foods. °¨ Too much intake of anything at one time. °¨ Dairy products until 24 to 48 hours after diarrhea stops. °· You may consume probiotics.  Probiotics are active cultures of beneficial bacteria. They may lessen the amount and number of diarrheal stools in adults. Probiotics can be found in yogurt with active cultures and in supplements. °· Wash your hands well to avoid spreading the virus. °· Only take over-the-counter or prescription medicines for pain, discomfort, or fever as directed by your caregiver. Do not give aspirin to children. Antidiarrheal medicines are not recommended. °· Ask your caregiver if you should continue to take your regular prescribed and over-the-counter medicines. °· Keep all follow-up appointments as directed by your caregiver. °SEEK IMMEDIATE MEDICAL CARE IF:  °· You are unable to keep fluids down. °· You do not urinate at least once every 6 to 8 hours. °· You develop shortness of breath. °· You notice blood in your stool or vomit. This may look like coffee grounds. °· You have abdominal pain that increases or is concentrated in one small area (localized). °· You have persistent vomiting or diarrhea. °· You have a fever. °· The patient is a child younger than 3 months, and he or she has a fever. °· The patient is a child older than 3 months, and he or she has a fever and persistent symptoms. °· The patient is a child older than 3 months, and he or she has a fever and symptoms suddenly get worse. °· The patient is a baby, and he or she has no tears when crying. °MAKE SURE YOU:  °· Understand these instructions. °· Will watch your condition. °· Will get help right away if you are not doing well or get worse. °Document Released: 09/06/2005 Document Revised: 11/29/2011 Document Reviewed: 06/23/2011 °  [employer] Patient Information 2015 [place]. This information is not intended to replace advice given to you by your health care provider. Make sure you discuss any questions you have with your health care provider.  Please return immediately if condition worsens. Please contact her primary physician or the physician you were  given for referral. If you have any specialist physicians involved in her treatment and plan please also contact them. Thank you for using [facility].

## 2015-05-28 NOTE — ED Provider Notes (Signed)
Time Seen: Approximately 1050  I have reviewed the triage notes  Chief Complaint: Diarrhea and Emesis   History of Present Illness: Patricia Le is a 59 y.o. female who presents with recent onset of nausea, vomiting, diarrhea which started last night. Patient states she's vomited multiple times with no blood or bile. She denies any food exposures she denies any focal abdominal pain. She denies any melena or hematochezia. She denies any recent travel or antibiotic therapy. He is not aware of anyone else sick at home. She denies any changes in her medication. She states that she does have a history of diverticulitis but again denies any abdominal pain at this point.   Past Medical History  Diagnosis Date  . Anemia   . GERD (gastroesophageal reflux disease)   . Hypertension   . Colon polyp   . Hyperlipidemia   . Cancer     DCIS right breast  . Breast cancer   . Hiatal hernia   . Restless leg syndrome     Patient Active Problem List   Diagnosis Date Noted  . 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 Abdominal hysterectomy    . Tonsillectomy    . Cesarean section    . Colonoscopy      over 10 yrs ago/ Dr Kristopher Glee  . Tubal ligation    . Cholecystectomy    . Back surgery    . Breast mammosite Right 07-02-14  . Breast surgery Right 06-06-14    Wide excision of Right breast DCIS  . Colonoscopy  11-20-14    Dr Jamal Collin hyperplastic polyp    Past Surgical History  Procedure Laterality Date  . Abdominal hysterectomy    . Tonsillectomy    . Cesarean section    . Colonoscopy      over 10 yrs ago/ Dr Kristopher Glee  . Tubal ligation    . Cholecystectomy    . Back surgery    . Breast mammosite Right 07-02-14  . Breast surgery Right 06-06-14    Wide excision of Right breast DCIS  . Colonoscopy  11-20-14    Dr Jamal Collin hyperplastic polyp    Current Outpatient Rx  Name  Route  Sig  Dispense  Refill  . gabapentin  (NEURONTIN) 300 MG capsule   Oral   Take 300 mg by mouth 3 (three) times daily.          Marland Kitchen letrozole (FEMARA) 2.5 MG tablet   Oral   Take 1 tablet (2.5 mg total) by mouth daily.   30 tablet   12   . meloxicam (MOBIC) 7.5 MG tablet   Oral   Take 7.5 mg by mouth as needed for pain.         . metoprolol tartrate (LOPRESSOR) 25 MG tablet   Oral   Take 25 mg by mouth 2 (two) times daily.          Marland Kitchen omeprazole (PRILOSEC) 40 MG capsule   Oral   Take 40 mg by mouth daily.          . ondansetron (ZOFRAN) 4 MG tablet   Oral   Take 1 tablet (4 mg total) by mouth every 8 (eight) hours as needed for nausea or vomiting.   21 tablet   0   . phentermine (ADIPEX-P) 37.5 MG tablet            0   . Vitamin D,  Ergocalciferol, (DRISDOL) 50000 UNITS CAPS capsule   Oral   Take 1 capsule by mouth every 7 (seven) days.            Allergies:  No known allergies  Family History: Family History  Problem Relation Age of Onset  . Cancer Father 64    melanoma  . Diabetes Mother     Social History: Social History  Substance Use Topics  . Smoking status: Former Smoker -- 1.00 packs/day for 20 years    Types: Cigarettes  . Smokeless tobacco: Never Used  . Alcohol Use: 0.0 oz/week    0 Standard drinks or equivalent per week     Review of Systems:   10 point review of systems was performed and was otherwise negative:  Constitutional: No fever Eyes: No visual disturbances ENT: No sore throat, ear pain Cardiac: No chest pain Respiratory: No shortness of breath, wheezing, or stridor Abdomen: No abdominal pain, no vomiting, No diarrhea Endocrine: No weight loss, No night sweats Extremities: No peripheral edema, cyanosis Skin: No rashes, easy bruising Neurologic: No focal weakness, trouble with speech or swollowing Urologic: No dysuria, Hematuria, or urinary frequency   Physical Exam:  ED Triage Vitals  Enc Vitals Group     BP 05/28/15 1037 132/103 mmHg     Pulse  Rate 05/28/15 1037 118     Resp 05/28/15 1037 18     Temp 05/28/15 1037 97.9 F (36.6 C)     Temp Source 05/28/15 1037 Oral     SpO2 05/28/15 1037 96 %     Weight 05/28/15 1037 160 lb (72.576 kg)     Height 05/28/15 1037 5' (1.524 m)     Head Cir --      Peak Flow --      Pain Score 05/28/15 1416 5     Pain Loc --      Pain Edu? --      Excl. in [place]? --     General: Awake , Alert , and Oriented times 3; GCS 15 Head: Normal cephalic , atraumatic Eyes: Pupils equal , round, reactive to light Nose/Throat: No nasal drainage, patent upper airway without erythema or exudate. Dry mucous membranes Neck: Supple, Full range of motion, No anterior adenopathy or palpable thyroid masses Lungs: Clear to ascultation without wheezes , rhonchi, or rales Heart: Regular rate, regular rhythm without murmurs , gallops , or rubs Abdomen: Soft, non tender without rebound, guarding , or rigidity; bowel sounds positive and symmetric in all 4 quadrants. No organomegaly .    No focal tenderness over McBurney's point, negative Murphy's sign. No palpable masses are noted    Extremities: 2 plus symmetric pulses. No edema, clubbing or cyanosis Neurologic: normal ambulation, Motor symmetric without deficits, sensory intact Skin: warm, dry, no rashes   Labs:   All laboratory work was reviewed including any pertinent negatives or positives listed below:  Labs Reviewed  LIPASE, BLOOD - Abnormal; Notable for the following:    Lipase 21 (*)    All other components within normal limits  COMPREHENSIVE METABOLIC PANEL - Abnormal; Notable for the following:    Glucose, Bld 155 (*)    Calcium 10.6 (*)    Total Protein 8.4 (*)    Albumin 5.2 (*)    All other components within normal limits  CBC - Abnormal; Notable for the following:    WBC 11.9 (*)    RBC 5.43 (*)    Hemoglobin 16.2 (*)    All other  components within normal limits  URINALYSIS COMPLETEWITH MICROSCOPIC ([facility] ONLY) - Abnormal; Notable for the  following:    Color, Urine AMBER (*)    APPearance HAZY (*)    Ketones, ur 1+ (*)    Protein, ur 100 (*)    Bacteria, UA RARE (*)    Squamous Epithelial / LPF 0-5 (*)    All other components within normal limits    EKG: ED ECG REPORT I, Daymon Larsen, the attending physician, personally viewed and interpreted this ECG.  Date: 05/28/2015 EKG Time: 1045 Rate: 118 Rhythm: Sinus tachycardia QRS Axis: normal Intervals: normal ST/T Wave abnormalities: normal Conduction Disutrbances: none Narrative Interpretation: unremarkable s      ED Course:  Differential diagnosis includes but is not exclusive to ovarian cyst, ovarian torsion, acute appendicitis, urinary tract infection, endometriosis, bowel obstruction, colitis, renal colic, gastroenteritis, etc. Given her current clinical presentation and objective findings this most likely is acute gastroenteritis. She has resolution of all abdominal cramping and I felt this was unlikely to be a surgical issue.  Assessment: Acute gastroenteritis   Final Clinical Impression: Final diagnoses:  Gastroenteritis, acute     Plan: Patient was advised to return immediately if condition worsens. Patient was advised to follow up with her primary care physician or other specialized physicians involved and in their current assessment.            Daymon Larsen, MD 05/28/15 [phone]

## 2015-07-24 ENCOUNTER — Other Ambulatory Visit: Payer: Self-pay | Admitting: General Surgery

## 2016-02-12 ENCOUNTER — Ambulatory Visit
Admission: RE | Admit: 2016-02-12 | Discharge: 2016-02-12 | Disposition: A | Discharge status: Home / Self Care | Payer: 59 | Source: Ambulatory Visit | Attending: Radiation Oncology | Admitting: Radiation Oncology

## 2016-02-12 ENCOUNTER — Encounter: Payer: Self-pay | Admitting: Radiation Oncology

## 2016-02-12 VITALS — BP 132/95 | HR 93 | Temp 96.7°F | Resp 20 | Wt 156.5 lb

## 2016-02-12 DIAGNOSIS — C50911 Malignant neoplasm of unspecified site of right female breast: Secondary | ICD-10-CM

## 2016-02-12 NOTE — Progress Notes (Signed)
Radiation Oncology Follow up Note  Name: Patricia Le   Date:   02/12/2016 MRN:  [id number] DOB: 11/05/1955    This 60 y.o. female presents to the clinic today for 1-1/2 year follow-up for accelerated partial breast radiation to right breast for ductal carcinoma in situ.  REFERRING PROVIDER: Lavera Guise, MD  HPI: Patient is a 60 year old female now seen out a year and a half having completed accelerated partial breast radiation to her right breast for ductal carcinoma in situ. Tumor was ER/PR positive she is currently on. Femara tolerating that well she does have some hot flashes. She specifically denies breast tenderness cough or bone pain. Mammograms back in June were fine  COMPLICATIONS OF TREATMENT: none  FOLLOW UP COMPLIANCE: keeps appointments   PHYSICAL EXAM:  BP 132/95 mmHg  Pulse 93  Temp(Src) 96.7 F (35.9 C)  Resp 20  Wt 156 lb 8.4 oz (71 kg) Lungs are clear to A&P cardiac examination essentially unremarkable with regular rate and rhythm. No dominant mass or nodularity is noted in either breast in 2 positions examined. Incision is well-healed. No axillary or supraclavicular adenopathy is appreciated. Cosmetic result is excellent. Well-developed well-nourished patient in NAD. HEENT reveals PERLA, EOMI, discs not visualized.  Oral cavity is clear. No oral mucosal lesions are identified. Neck is clear without evidence of cervical or supraclavicular adenopathy. Lungs are clear to A&P. Cardiac examination is essentially unremarkable with regular rate and rhythm without murmur rub or thrill. Abdomen is benign with no organomegaly or masses noted. Motor sensory and DTR levels are equal and symmetric in the upper and lower extremities. Cranial nerves II through XII are grossly intact. Proprioception is intact. No peripheral adenopathy or edema is identified. No motor or sensory levels are noted. Crude visual fields are within normal range.  RADIOLOGY RESULTS: Mammograms are reviewed  and benign  PLAN: Present time she is a year and half out doing well with no evidence of disease. She continues on Femara without side effect except for hot flashes. I've asked to see her back in 1 year for follow-up. Patient is to call sooner with any concerns. Follow-up mammograms already been ordered.  I would like to take this opportunity to thank you for allowing me to participate in the care of your patient.Armstead Peaks., MD

## 2016-05-17 ENCOUNTER — Encounter: Payer: Self-pay | Admitting: General Surgery

## 2016-05-20 ENCOUNTER — Ambulatory Visit (INDEPENDENT_AMBULATORY_CARE_PROVIDER_SITE_OTHER): Payer: 59 | Admitting: General Surgery

## 2016-05-20 ENCOUNTER — Encounter: Payer: Self-pay | Admitting: General Surgery

## 2016-05-20 VITALS — BP 114/72 | HR 80 | Resp 12 | Ht 60.0 in | Wt 157.0 lb

## 2016-05-20 DIAGNOSIS — D0511 Intraductal carcinoma in situ of right breast: Secondary | ICD-10-CM

## 2016-05-20 NOTE — Patient Instructions (Addendum)
The patient is aware to call back for any questions or concerns.  Patient will be asked to return to the office in one year with a bilateral diagnostic mammogram.  

## 2016-05-20 NOTE — Progress Notes (Signed)
Patient ID: Patricia Le, female   DOB: 12-25-1955, 60 y.o.   MRN: [id number]  Chief Complaint  Patient presents with  . Follow-up    mammogram    HPI Patricia Le is a 60 y.o. female here today for a right breast cancer follow up. Last mammogram done on 05/07/16. She does perform self breast checks. No new breast issues. Tolerating letrozole. Colonoscopy completed March of 2016 showed one hyperplastic polyp. I have reviewed the history of present illness with the patient.  HPI  Past Medical History:  Diagnosis Date  . Anemia   . Breast cancer ([place])   . Cancer [facility])    DCIS right breast  . Colon polyp   . GERD (gastroesophageal reflux disease)   . Hiatal hernia   . Hyperlipidemia   . Hypertension   . Restless leg syndrome     Past Surgical History:  Procedure Laterality Date  . ABDOMINAL HYSTERECTOMY    . BACK SURGERY    . BREAST MAMMOSITE Right 07-02-14  . BREAST SURGERY Right 06-06-14   Wide excision of Right breast DCIS  . CESAREAN SECTION    . CHOLECYSTECTOMY    . COLONOSCOPY     over 10 yrs ago/ Dr Kristopher Glee  . COLONOSCOPY  11-20-14   Dr Jamal Collin hyperplastic polyp  . TONSILLECTOMY    . TUBAL LIGATION      Family History  Problem Relation Age of Onset  . Cancer Father 76    melanoma  . Diabetes Mother     Social History Social History  Substance Use Topics  . Smoking status: Former Smoker    Packs/day: 1.00    Years: 20.00    Types: Cigarettes  . Smokeless tobacco: Never Used  . Alcohol use 0.0 oz/week    Allergies  Allergen Reactions  . No Known Allergies     Current Outpatient Prescriptions  Medication Sig Dispense Refill  . gabapentin (NEURONTIN) 300 MG capsule Take 300 mg by mouth 3 (three) times daily.     Marland Kitchen letrozole (FEMARA) 2.5 MG tablet Take 1 tablet by mouth  daily 90 tablet 3  . meloxicam (MOBIC) 7.5 MG tablet Take 7.5 mg by mouth as needed for pain.    . metoprolol tartrate (LOPRESSOR) 25 MG tablet Take 25 mg by mouth 2 (two) times  daily.     Marland Kitchen omeprazole (PRILOSEC) 40 MG capsule Take 40 mg by mouth daily.     . ondansetron (ZOFRAN) 4 MG tablet Take 1 tablet (4 mg total) by mouth every 8 (eight) hours as needed for nausea or vomiting. 21 tablet 0  . Phendimetrazine Tartrate 35 MG TABS TAKE 1 TABLET BY MOUTH TWICE A DAY FOR WEIGHT LOSS  0  . phentermine (ADIPEX-P) 37.5 MG tablet   0  . triamcinolone cream (KENALOG) 0.1 % APPLY TO AFFECTED AREA TWICE A DAY FOR 10 DAYS  1  . Vitamin D, Ergocalciferol, (DRISDOL) 50000 UNITS CAPS capsule Take 1 capsule by mouth every 7 (seven) days.      No current facility-administered medications for this visit.     Review of Systems Review of Systems  Constitutional: Negative.   Respiratory: Negative.   Cardiovascular: Negative.     Blood pressure 114/72, pulse 80, resp. rate 12, height 5' (1.524 m), weight 157 lb (71.2 kg).  Physical Exam Physical Exam  Constitutional: She is oriented to person, place, and time. She appears well-developed and well-nourished.  HENT:  Mouth/Throat: Oropharynx is clear and moist.  Eyes: Conjunctivae are normal. No scleral icterus.  Neck: Neck supple. No thyromegaly present.  Cardiovascular: Normal rate, regular rhythm and normal heart sounds.   Pulmonary/Chest: Effort normal and breath sounds normal. Right breast exhibits no inverted nipple, no mass, no nipple discharge, no skin change and no tenderness. Left breast exhibits no inverted nipple, no mass, no nipple discharge, no skin change and no tenderness.  Incision well healed right breast, no palpable findings.  Abdominal: Soft. There is no tenderness.  Lymphadenopathy:    She has no cervical adenopathy.    She has no axillary adenopathy.  Neurological: She is alert and oriented to person, place, and time.  Skin: Skin is warm and dry.  Psychiatric: Her behavior is normal.    Data Reviewed Mammogram reviewed-stable. Stable left breast findings unchanged from before.  Assessment     Stable physical exam. 2 years post right breast lumpectomy and mammosite for DCIS- hormone pos. Currently on Letrazole and doing well       Plan    Patient will be asked to return to the office in one year with a bilateral diagnostic mammogram.      This information has been scribed by Karie Fetch RN, BSN,BC.   SANKAR,SEEPLAPUTHUR G 05/20/2016, 10:50 AM

## 2017-02-17 ENCOUNTER — Encounter: Payer: Self-pay | Admitting: Radiation Oncology

## 2017-02-17 ENCOUNTER — Ambulatory Visit
Admission: RE | Admit: 2017-02-17 | Discharge: 2017-02-17 | Disposition: A | Discharge status: Home / Self Care | Payer: 59 | Source: Ambulatory Visit | Attending: Radiation Oncology | Admitting: Radiation Oncology

## 2017-02-17 VITALS — BP 129/85 | HR 85 | Temp 98.1°F | Wt 170.9 lb

## 2017-02-17 DIAGNOSIS — Z923 Personal history of irradiation: Secondary | ICD-10-CM | POA: Diagnosis not present

## 2017-02-17 DIAGNOSIS — Z170 Estrogen receptor positive status [ER+]: Secondary | ICD-10-CM | POA: Diagnosis not present

## 2017-02-17 DIAGNOSIS — Z79811 Long term (current) use of aromatase inhibitors: Secondary | ICD-10-CM | POA: Insufficient documentation

## 2017-02-17 DIAGNOSIS — D0511 Intraductal carcinoma in situ of right breast: Secondary | ICD-10-CM

## 2017-02-17 NOTE — Progress Notes (Signed)
Radiation Oncology Follow up Note  Name: Patricia Le   Date:   02/17/2017 MRN:  [id number] DOB: [birth year]    This 61 y.o. female presents to the clinic today for 2 after follow-up status post accelerated partial breast irradiation to her right breastfor ductal carcinoma in situ.  REFERRING PROVIDER: Lavera Guise, MD  HPI: Patricia Le is a 61 year old female now out 2 half years having completed accelerated partial breast irradiation to her right breast for ductal carcinoma in situ ER/PR positive. She is seen today in routine follow-up is doing well. Currently on Femara tolerating that well without side effect.her mammograms have been fine. She specifically denies breast tenderness cough or bone pain.  COMPLICATIONS OF TREATMENT: none  FOLLOW UP COMPLIANCE: keeps appointments   PHYSICAL EXAM:  BP 129/85   Pulse 85   Temp 98.1 F (36.7 C)   Wt 170 lb 13.7 oz (77.5 kg)   BMI 33.37 kg/m  Lungs are clear to A&P cardiac examination essentially unremarkable with regular rate and rhythm. No dominant mass or nodularity is noted in either breast in 2 positions examined. Incision is well-healed. No axillary or supraclavicular adenopathy is appreciated. Cosmetic result is excellent.Well-developed well-nourished patient in NAD. HEENT reveals PERLA, EOMI, discs not visualized.  Oral cavity is clear. No oral mucosal lesions are identified. Neck is clear without evidence of cervical or supraclavicular adenopathy. Lungs are clear to A&P. Cardiac examination is essentially unremarkable with regular rate and rhythm without murmur rub or thrill. Abdomen is benign with no organomegaly or masses noted. Motor sensory and DTR levels are equal and symmetric in the upper and lower extremities. Cranial nerves II through XII are grossly intact. Proprioception is intact. No peripheral adenopathy or edema is identified. No motor or sensory levels are noted. Crude visual fields are within normal range.  RADIOLOGY  RESULTS: ammograms have been requested for my review  PLAN: present time she is now 2 and half years out with no evidence of disease doing well. She continues on Femara without side effect. I'm please were overall progress. I've asked to see her back in 1 year for follow-up. She knows to call sooner with any concerns.  I would like to take this opportunity to thank you for allowing me to participate in the care of your patient.Armstead Peaks., MD

## 2017-03-02 ENCOUNTER — Telehealth: Payer: Self-pay

## 2017-03-02 ENCOUNTER — Other Ambulatory Visit: Payer: Self-pay

## 2017-03-02 DIAGNOSIS — D0511 Intraductal carcinoma in situ of right breast: Secondary | ICD-10-CM

## 2017-03-02 NOTE — Telephone Encounter (Signed)
Message left for patient to call back to let us know where she wants her next mammogram done at. Either [facility] or [facility].

## 2017-03-02 NOTE — Telephone Encounter (Signed)
Patient to go to [facility] for her next mammograms.

## 2017-03-03 NOTE — Addendum Note (Signed)
Addended by: Lesly Rubenstein on: 03/03/2017 11:09 AM   Modules accepted: Orders

## 2017-03-09 ENCOUNTER — Inpatient Hospital Stay
Admission: RE | Admit: 2017-03-09 | Discharge: 2017-03-09 | Disposition: A | Discharge status: Home / Self Care | Payer: Self-pay | Source: Ambulatory Visit | Attending: *Deleted | Admitting: *Deleted

## 2017-03-09 ENCOUNTER — Other Ambulatory Visit: Payer: Self-pay | Admitting: *Deleted

## 2017-03-09 DIAGNOSIS — Z9289 Personal history of other medical treatment: Secondary | ICD-10-CM

## 2017-05-17 ENCOUNTER — Ambulatory Visit
Admission: RE | Admit: 2017-05-17 | Discharge: 2017-05-17 | Disposition: A | Discharge status: Home / Self Care | Payer: 59 | Source: Ambulatory Visit | Attending: General Surgery | Admitting: General Surgery

## 2017-05-17 DIAGNOSIS — Z853 Personal history of malignant neoplasm of breast: Secondary | ICD-10-CM | POA: Diagnosis present

## 2017-05-17 DIAGNOSIS — Z9889 Other specified postprocedural states: Secondary | ICD-10-CM | POA: Insufficient documentation

## 2017-05-17 DIAGNOSIS — D0511 Intraductal carcinoma in situ of right breast: Secondary | ICD-10-CM

## 2017-05-26 ENCOUNTER — Encounter: Payer: Self-pay | Admitting: General Surgery

## 2017-05-26 ENCOUNTER — Ambulatory Visit (INDEPENDENT_AMBULATORY_CARE_PROVIDER_SITE_OTHER): Payer: 59 | Admitting: General Surgery

## 2017-05-26 VITALS — BP 136/88 | HR 100 | Resp 12 | Ht 60.0 in | Wt 175.0 lb

## 2017-05-26 DIAGNOSIS — D0511 Intraductal carcinoma in situ of right breast: Secondary | ICD-10-CM

## 2017-05-26 NOTE — Patient Instructions (Addendum)
The patient is aware to call back for any questions or concerns. discuss bone density with Dr Humphrey Rolls Patient will be asked to return to the office in one year with a bilateral diagnostic mammogram with Dr Bary Castilla.

## 2017-05-26 NOTE — Progress Notes (Signed)
Patient ID: Patricia Le, female   DOB: 1956-02-24, 61 y.o.   MRN: [id number]  Chief Complaint  Patient presents with  . Follow-up    HPI Patricia Le is a 61 y.o. female.  here today for a right breast cancer follow up. Last mammogram done on 05/17/17. She does perform self breast checks. No new breast issues. Tolerating letrozole with mild hot flashes and weight gain.   Colonoscopy completed March of 2016 showed one hyperplastic polyp.  HPI  Past Medical History:  Diagnosis Date  . Anemia   . Breast cancer ([place]) 2015   DCIS  . Cancer [facility])    DCIS right breast  . Colon polyp   . GERD (gastroesophageal reflux disease)   . Hiatal hernia   . Hyperlipidemia   . Hypertension   . Restless leg syndrome     Past Surgical History:  Procedure Laterality Date  . ABDOMINAL HYSTERECTOMY    . BACK SURGERY    . BREAST BIOPSY Right 2015   DCIS  . BREAST LUMPECTOMY Right 2015   DCIS with Mammosite  . BREAST MAMMOSITE Right 07-02-14  . BREAST SURGERY Right 06-06-14   Wide excision of Right breast DCIS  . CESAREAN SECTION    . CHOLECYSTECTOMY    . COLONOSCOPY     over 10 yrs ago/ Dr Kristopher Glee  . COLONOSCOPY  11-20-14   Dr Jamal Collin hyperplastic polyp  . TONSILLECTOMY    . TUBAL LIGATION      Family History  Problem Relation Age of Onset  . Diabetes Mother   . Cancer Father 52       melanoma  . Breast cancer Neg Hx     Social History Social History  Substance Use Topics  . Smoking status: Former Smoker    Packs/day: 1.00    Years: 20.00    Types: Cigarettes  . Smokeless tobacco: Never Used  . Alcohol use 0.0 oz/week    Allergies  Allergen Reactions  . No Known Allergies     Current Outpatient Prescriptions  Medication Sig Dispense Refill  . cetirizine (ZYRTEC) 10 MG tablet TAKE 1 BY MOUTH DAILY AT BEDTIME  3  . fluticasone (FLONASE) 50 MCG/ACT nasal spray 2 SPRAYS IN BOTH NOSTRILS EVERY DAY FOR RHINITIS  3  . gabapentin (NEURONTIN) 300 MG capsule Take 300 mg by  mouth 3 (three) times daily.     Marland Kitchen letrozole (FEMARA) 2.5 MG tablet Take 1 tablet by mouth  daily 90 tablet 3  . meloxicam (MOBIC) 7.5 MG tablet Take 7.5 mg by mouth as needed for pain.    . metoprolol tartrate (LOPRESSOR) 25 MG tablet Take 25 mg by mouth 2 (two) times daily.     Marland Kitchen omeprazole (PRILOSEC) 40 MG capsule Take 40 mg by mouth daily.     Marland Kitchen triamcinolone cream (KENALOG) 0.1 % APPLY TO AFFECTED AREA TWICE A DAY FOR 10 DAYS  1  . Vitamin D, Ergocalciferol, (DRISDOL) 50000 UNITS CAPS capsule Take 1 capsule by mouth every 7 (seven) days.      No current facility-administered medications for this visit.     Review of Systems Review of Systems  Constitutional: Negative.   Respiratory: Negative.   Cardiovascular: Negative.     Blood pressure 136/88, pulse 100, resp. rate 12, height 5' (1.524 m), weight 175 lb (79.4 kg).  Physical Exam Physical Exam  Constitutional: She is oriented to person, place, and time. She appears well-developed and well-nourished.  HENT:  Mouth/Throat: Oropharynx  is clear and moist.  Eyes: Conjunctivae are normal. No scleral icterus.  Neck: Neck supple.  Cardiovascular: Normal rate, regular rhythm and normal heart sounds.   Pulmonary/Chest: Effort normal and breath sounds normal. Right breast exhibits no inverted nipple, no mass, no nipple discharge, no skin change and no tenderness. Left breast exhibits no inverted nipple, no mass, no nipple discharge, no skin change and no tenderness.  Incision well healed right breast, no palpable findings  Abdominal: Soft. There is no tenderness.  Lymphadenopathy:    She has no cervical adenopathy.    She has no axillary adenopathy.  Neurological: She is alert and oriented to person, place, and time.  Skin: Skin is warm and dry.  Psychiatric: Her behavior is normal.    Data Reviewed  Mammogram reviewed and stable.  Assessment    Stable physical exam. 3 years post right breast lumpectomy and mammosite for  DCIS- hormone pos. Currently on Letrazole and doing well.  Colonoscopy completed March of 2016 showed one hyperplastic polyp.    Plan    She will discuss bone density with Dr Humphrey Rolls. Patient will be asked to return to the office in one year with a bilateral diagnostic mammogram with Dr Bary Castilla.      HPI, Physical Exam, Assessment and Plan have been scribed under the direction and in the presence of Mckinley Jewel, MD Karie Fetch, RN I have completed the exam and reviewed the above documentation for accuracy and completeness.  I agree with the above.  Haematologist has been used and any errors in dictation or transcription are unintentional.  Kei Mcelhiney G. Jamal Collin, M.D., F.A.C.S.   Junie Panning G 05/30/2017, 12:45 PM

## 2017-07-21 ENCOUNTER — Telehealth: Payer: Self-pay | Admitting: *Deleted

## 2017-07-21 MED ORDER — LETROZOLE 2.5 MG PO TABS: 2.5000 mg | ORAL_TABLET | Freq: Every day | ORAL | 3 refills | Status: DC

## 2017-07-21 NOTE — Telephone Encounter (Signed)
Patient called and wants to get refills for Letrozole (Femara)2.5mg  tablet, 90 day supply sent in to [facility]. Patient also wants a call back once that is done

## 2017-11-29 ENCOUNTER — Telehealth: Payer: Self-pay

## 2017-11-29 NOTE — Telephone Encounter (Signed)
Called patient and left message, we do have samples linzess to pick up per HB.

## 2017-11-30 ENCOUNTER — Telehealth: Payer: Self-pay

## 2017-11-30 NOTE — Telephone Encounter (Signed)
Patient

## 2017-11-30 NOTE — Telephone Encounter (Signed)
-----   Message from Ronnell Freshwater, NP sent at 11/29/2017  8:00 AM EDT ----- We have samples of linzess we can give her, 170mcg. Taken daily with food and plently of water. Should help relieve symptoms. ----- Message ----- From: Laurie Panda Sent: 11/28/2017   3:16 PM To: Ronnell Freshwater, NP  Patient called and has not had a BM since last Friday and she is in pain and belly is swollen with cramps and wants relief what can she take otc or samples in office? br

## 2017-12-19 ENCOUNTER — Ambulatory Visit: Payer: Self-pay | Admitting: Nurse Practitioner

## 2018-01-12 ENCOUNTER — Encounter: Payer: Self-pay | Admitting: Nurse Practitioner

## 2018-01-12 ENCOUNTER — Ambulatory Visit: Payer: Self-pay | Admitting: Nurse Practitioner

## 2018-01-12 VITALS — BP 125/87 | HR 75 | Resp 16 | Ht 60.0 in | Wt 177.0 lb

## 2018-01-12 DIAGNOSIS — D0511 Intraductal carcinoma in situ of right breast: Secondary | ICD-10-CM

## 2018-01-12 DIAGNOSIS — I10 Essential (primary) hypertension: Secondary | ICD-10-CM | POA: Diagnosis not present

## 2018-01-12 DIAGNOSIS — K219 Gastro-esophageal reflux disease without esophagitis: Secondary | ICD-10-CM

## 2018-01-12 DIAGNOSIS — J449 Chronic obstructive pulmonary disease, unspecified: Secondary | ICD-10-CM | POA: Diagnosis not present

## 2018-01-12 NOTE — Progress Notes (Signed)
[facility] [place], [place] [address]  Internal MEDICINE  Office Visit Note  Patient Name: Patricia Le  [id number]  [id number]  Date of Service: 02/01/2018   Pt is here for routine follow up.  Chief Complaint  Patient presents with  . COPD    shortness of breath. stable.       COPD  She complains of difficulty breathing, shortness of breath and wheezing. There is no cough. This is a chronic problem. The current episode started more than 1 year ago. The problem occurs daily. The problem has been unchanged. Associated symptoms include dyspnea on exertion and malaise/fatigue. Pertinent negatives include no chest pain, postnasal drip, rhinorrhea, sneezing or sore throat. Her symptoms are aggravated by any activity, exercise and URI. Her symptoms are alleviated by rest, steroid inhaler and beta-agonist. She reports moderate improvement on treatment. Her symptoms are not alleviated by change in position. Her past medical history is significant for asthma and COPD.       Current Medication: Outpatient Encounter Medications as of 01/12/2018  Medication Sig Note  . budesonide-formoterol (SYMBICORT) 80-4.5 MCG/ACT inhaler Inhale 2 puffs into the lungs daily as needed.   . cetirizine (ZYRTEC) 10 MG tablet TAKE 1 BY MOUTH DAILY AT BEDTIME   . gabapentin (NEURONTIN) 300 MG capsule Take 300 mg by mouth 3 (three) times daily.  05/08/2014: Received from: External Pharmacy  . letrozole (FEMARA) 2.5 MG tablet Take 1 tablet by mouth  daily   . meloxicam (MOBIC) 7.5 MG tablet Take 7.5 mg by mouth as needed for pain.   . metoprolol tartrate (LOPRESSOR) 25 MG tablet Take 25 mg by mouth 2 (two) times daily.  05/08/2014: Received from: External Pharmacy  . omeprazole (PRILOSEC) 40 MG capsule Take 40 mg by mouth daily.  05/08/2014: Received from: External Pharmacy  . triamcinolone cream (KENALOG) 0.1 % APPLY TO AFFECTED AREA TWICE A DAY FOR 10 DAYS 05/20/2016: Received from:  External Pharmacy  . fluticasone (FLONASE) 50 MCG/ACT nasal spray 2 SPRAYS IN BOTH NOSTRILS EVERY DAY FOR RHINITIS   . [DISCONTINUED] letrozole (FEMARA) 2.5 MG tablet Take 1 tablet (2.5 mg total) by mouth daily.   . [DISCONTINUED] Vitamin D, Ergocalciferol, (DRISDOL) 50000 UNITS CAPS capsule Take 1 capsule by mouth every 7 (seven) days.  05/15/2015: Received from: External Pharmacy Received Sig:    No facility-administered encounter medications on file as of 01/12/2018.     Surgical History: Past Surgical History:  Procedure Laterality Date  . ABDOMINAL HYSTERECTOMY    . BACK SURGERY    . BREAST BIOPSY Right 2015   DCIS  . BREAST LUMPECTOMY Right 2015   DCIS with Mammosite  . BREAST MAMMOSITE Right 07-02-14  . BREAST SURGERY Right 06-06-14   Wide excision of Right breast DCIS  . CESAREAN SECTION    . CHOLECYSTECTOMY    . COLONOSCOPY     over 10 yrs ago/ Dr Kristopher Glee  . COLONOSCOPY  11-20-14   Dr Jamal Collin hyperplastic polyp  . TONSILLECTOMY    . TUBAL LIGATION      Medical History: Past Medical History:  Diagnosis Date  . Anemia   . Breast cancer ([place]) 2015   DCIS  . Cancer [facility])    DCIS right breast  . Colon polyp   . GERD (gastroesophageal reflux disease)   . Hiatal hernia   . Hyperlipidemia   . Hypertension   . Restless leg syndrome     Family History: Family History  Problem Relation  Age of Onset  . Diabetes Mother   . Cancer Father 64       melanoma  . Breast cancer Neg Hx     Social History   Socioeconomic History  . Marital status: Divorced    Spouse name: Not on file  . Number of children: Not on file  . Years of education: Not on file  . Highest education level: Not on file  Occupational History  . Not on file  Social Needs  . Financial resource strain: Not on file  . Food insecurity:    Worry: Not on file    Inability: Not on file  . Transportation needs:    Medical: Not on file    Non-medical: Not on file  Tobacco Use  . Smoking status:  Former Smoker    Packs/day: 1.00    Years: 20.00    Pack years: 20.00    Types: Cigarettes  . Smokeless tobacco: Never Used  Substance and Sexual Activity  . Alcohol use: Yes    Alcohol/week: 0.0 oz  . Drug use: Not on file  . Sexual activity: Not on file  Lifestyle  . Physical activity:    Days per week: Not on file    Minutes per session: Not on file  . Stress: Not on file  Relationships  . Social connections:    Talks on phone: Not on file    Gets together: Not on file    Attends religious service: Not on file    Active member of club or organization: Not on file    Attends meetings of clubs or organizations: Not on file    Relationship status: Not on file  . Intimate partner violence:    Fear of current or ex partner: Not on file    Emotionally abused: Not on file    Physically abused: Not on file    Forced sexual activity: Not on file  Other Topics Concern  . Not on file  Social History Narrative  . Not on file      Review of Systems  Constitutional: Positive for activity change, fatigue and malaise/fatigue. Negative for chills and unexpected weight change.  HENT: Negative for congestion, postnasal drip, rhinorrhea, sneezing and sore throat.   Eyes: Negative.  Negative for redness.  Respiratory: Positive for shortness of breath and wheezing. Negative for cough and chest tightness.        Intermittent and controlled through use of inhalers.   Cardiovascular: Positive for dyspnea on exertion. Negative for chest pain and palpitations.  Gastrointestinal: Positive for diarrhea. Negative for abdominal pain, constipation, nausea and vomiting.       Intermittent  Endocrine: Negative for polydipsia, polyphagia and polyuria.  Genitourinary: Negative.  Negative for dysuria and frequency.  Musculoskeletal: Positive for back pain. Negative for arthralgias, joint swelling and neck pain.       Bilateral foot pain  Skin: Negative for rash.  Allergic/Immunologic: Positive for  environmental allergies.  Neurological: Positive for numbness. Negative for tremors.       Burning on bottoms of feet   Hematological: Negative for adenopathy. Does not bruise/bleed easily.  Psychiatric/Behavioral: Negative for behavioral problems (Depression), dysphoric mood, sleep disturbance and suicidal ideas. The patient is not nervous/anxious.     Today's Vitals   01/12/18 1012  BP: 125/87  Pulse: 75  Resp: 16  SpO2: 98%  Weight: 177 lb (80.3 kg)  Height: 5' (1.524 m)    Physical Exam  Constitutional: She is oriented to  person, place, and time. She appears well-developed and well-nourished. No distress.  HENT:  Head: Normocephalic and atraumatic.  Mouth/Throat: Oropharynx is clear and moist. No oropharyngeal exudate.  Eyes: Pupils are equal, round, and reactive to light. EOM are normal.  Neck: Normal range of motion. Neck supple. No JVD present. No tracheal deviation present. No thyromegaly present.  Cardiovascular: Normal rate, regular rhythm and normal heart sounds. Exam reveals no gallop and no friction rub.  No murmur heard. Pulmonary/Chest: Effort normal. No respiratory distress. She has no wheezes. She has no rales. She exhibits no tenderness.  Abdominal: Soft. Bowel sounds are normal.  Musculoskeletal: Normal range of motion.  Lymphadenopathy:    She has no cervical adenopathy.  Neurological: She is alert and oriented to person, place, and time. No cranial nerve deficit.  Skin: Skin is warm and dry. She is not diaphoretic.  Psychiatric: She has a normal mood and affect. Her behavior is normal. Judgment and thought content normal.  Nursing note and vitals reviewed.   Assessment/Plan: 1. Chronic obstructive pulmonary disease, unspecified COPD type ([place]) Doing well. Continue symbicort twice daily and use rescue inhaler as needed and as prescribed   2. Essential hypertension Stable. Continue bp medication as prescribed   3. Gastroesophageal reflux disease without  esophagitis Use omeprazole as prescribed   4. Ductal carcinoma in situ (DCIS) of right breast Letrozole as prescribed. Regular visits with surgery as scheduled.   General Counseling: Caniya verbalizes understanding of the findings of todays visit and agrees with plan of treatment. I have discussed any further diagnostic evaluation that may be needed or ordered today. We also reviewed her medications today. she has been encouraged to call the office with any questions or concerns that should arise related to todays visit.   This patient was seen by Leretha Pol, FNP- C in Collaboration with Dr Lavera Guise as a part of collaborative care agreement   Time spent: 66 Minutes    Dr Lavera Guise Internal medicine

## 2018-02-01 DIAGNOSIS — K219 Gastro-esophageal reflux disease without esophagitis: Secondary | ICD-10-CM | POA: Insufficient documentation

## 2018-02-01 DIAGNOSIS — I10 Essential (primary) hypertension: Secondary | ICD-10-CM | POA: Insufficient documentation

## 2018-02-01 DIAGNOSIS — J449 Chronic obstructive pulmonary disease, unspecified: Secondary | ICD-10-CM | POA: Insufficient documentation

## 2018-02-15 ENCOUNTER — Other Ambulatory Visit: Payer: Self-pay | Admitting: Nurse Practitioner

## 2018-02-15 ENCOUNTER — Encounter: Payer: Self-pay | Admitting: Internal Medicine

## 2018-02-15 ENCOUNTER — Ambulatory Visit: Payer: Managed Care, Other (non HMO) | Admitting: Internal Medicine

## 2018-02-15 VITALS — BP 127/87 | HR 87 | Resp 16 | Ht 60.0 in | Wt 177.8 lb

## 2018-02-15 DIAGNOSIS — J449 Chronic obstructive pulmonary disease, unspecified: Secondary | ICD-10-CM | POA: Diagnosis not present

## 2018-02-15 DIAGNOSIS — I10 Essential (primary) hypertension: Secondary | ICD-10-CM

## 2018-02-15 DIAGNOSIS — J0140 Acute pansinusitis, unspecified: Secondary | ICD-10-CM | POA: Diagnosis not present

## 2018-02-15 DIAGNOSIS — H109 Unspecified conjunctivitis: Secondary | ICD-10-CM | POA: Diagnosis not present

## 2018-02-15 DIAGNOSIS — B9689 Other specified bacterial agents as the cause of diseases classified elsewhere: Secondary | ICD-10-CM

## 2018-02-15 MED ORDER — POLYMYXIN B-TRIMETHOPRIM 10000-0.1 UNIT/ML-% OP SOLN: 1.0000 [drp] | OPHTHALMIC | 0 refills | Status: DC

## 2018-02-15 MED ORDER — AMOXICILLIN 875 MG PO TABS: 875.0000 mg | ORAL_TABLET | Freq: Two times a day (BID) | ORAL | 0 refills | Status: DC

## 2018-02-15 NOTE — Progress Notes (Signed)
[facility] [place], [place] [address]  Internal MEDICINE  Office Visit Note  Patient Name: Patricia Le  [id number]  [id number]  Date of Service: 02/16/2018   Pt is here for a sick visit.  Chief Complaint  Patient presents with  . Eyelid Problem    right eyelid swollen for about a week lid felt dry like it was sticking to eyeball and used eye drops but it would get dry again by the end of the day  . Nasal Congestion    no fever or chills  . Cough     Cough  This is a new problem. The current episode started in the past 7 days. The problem has been unchanged. The problem occurs constantly. The cough is non-productive. Associated symptoms include chills, ear congestion, ear pain, headaches, postnasal drip, rhinorrhea, a sore throat and wheezing. Pertinent negatives include no fever, myalgias or rash. The symptoms are aggravated by pollens and dust. She has tried OTC cough suppressant, a beta-agonist inhaler and steroid inhaler for the symptoms. The treatment provided mild relief. Her past medical history is significant for asthma, COPD and environmental allergies.        Current Medication:  Outpatient Encounter Medications as of 02/15/2018  Medication Sig Note  . budesonide-formoterol (SYMBICORT) 80-4.5 MCG/ACT inhaler Inhale 2 puffs into the lungs daily as needed.   . cetirizine (ZYRTEC) 10 MG tablet TAKE 1 BY MOUTH DAILY AT BEDTIME   . fluticasone (FLONASE) 50 MCG/ACT nasal spray 2 SPRAYS IN BOTH NOSTRILS EVERY DAY FOR RHINITIS   . gabapentin (NEURONTIN) 300 MG capsule Take 300 mg by mouth 3 (three) times daily.  05/08/2014: Received from: External Pharmacy  . letrozole (FEMARA) 2.5 MG tablet Take 1 tablet by mouth  daily   . meloxicam (MOBIC) 7.5 MG tablet Take 7.5 mg by mouth as needed for pain.   . metoprolol tartrate (LOPRESSOR) 25 MG tablet Take 25 mg by mouth 2 (two) times daily.  05/08/2014: Received from: External Pharmacy  . omeprazole  (PRILOSEC) 40 MG capsule Take 40 mg by mouth daily.  05/08/2014: Received from: External Pharmacy  . triamcinolone cream (KENALOG) 0.1 % APPLY TO AFFECTED AREA TWICE A DAY FOR 10 DAYS 05/20/2016: Received from: External Pharmacy  . amoxicillin (AMOXIL) 875 MG tablet Take 1 tablet (875 mg total) by mouth 2 (two) times daily.   . [DISCONTINUED] trimethoprim-polymyxin b (POLYTRIM) ophthalmic solution Place 1 drop into both eyes every 4 (four) hours.    No facility-administered encounter medications on file as of 02/15/2018.       Medical History: Past Medical History:  Diagnosis Date  . Anemia   . Breast cancer ([place]) 2015   DCIS  . Cancer [facility])    DCIS right breast  . Colon polyp   . GERD (gastroesophageal reflux disease)   . Hiatal hernia   . Hyperlipidemia   . Hypertension   . Restless leg syndrome      Today's Vitals   02/15/18 1437  BP: 127/87  Pulse: 87  Resp: 16  SpO2: 96%  Weight: 177 lb 12.8 oz (80.6 kg)  Height: 5' (1.524 m)   Review of Systems  Constitutional: Positive for chills and fatigue. Negative for fever.  HENT: Positive for congestion, ear pain, postnasal drip, rhinorrhea, sinus pain, sore throat and voice change.   Eyes: Positive for pain and itching.       Moderate eye swelling, bilaterally with lots of drainage.   Respiratory: Positive for  cough and wheezing.   Cardiovascular: Negative.   Gastrointestinal: Positive for nausea. Negative for vomiting.  Endocrine: Negative for cold intolerance, heat intolerance, polydipsia, polyphagia and polyuria.  Genitourinary: Negative.   Musculoskeletal: Negative for arthralgias, back pain and myalgias.  Skin: Negative.  Negative for rash.  Allergic/Immunologic: Positive for environmental allergies.  Neurological: Positive for headaches.  Hematological: Positive for adenopathy.  Psychiatric/Behavioral: Negative.  Negative for dysphoric mood and sleep disturbance. The patient is not nervous/anxious.     Physical  Exam  Constitutional: She is oriented to person, place, and time. She appears well-developed and well-nourished. She appears ill. No distress.  HENT:  Head: Normocephalic and atraumatic.  Right Ear: Tympanic membrane is bulging.  Left Ear: Tympanic membrane is bulging.  Nose: Rhinorrhea present. Right sinus exhibits maxillary sinus tenderness and frontal sinus tenderness. Left sinus exhibits maxillary sinus tenderness and frontal sinus tenderness.  Mouth/Throat: Oropharynx is clear and moist. No oropharyngeal exudate.  Post nasal drip is evident.   Eyes: Pupils are equal, round, and reactive to light. EOM are normal.  Neck: Normal range of motion. Neck supple. No JVD present. No tracheal deviation present. No thyromegaly present.  Cardiovascular: Normal rate, regular rhythm and normal heart sounds. Exam reveals no gallop and no friction rub.  No murmur heard. Pulmonary/Chest: Effort normal and breath sounds normal. No respiratory distress. She has no wheezes. She has no rales. She exhibits no tenderness.  Congested, non-productive cough present .  Abdominal: Soft. Bowel sounds are normal. There is no tenderness.  Musculoskeletal: Normal range of motion.  Lymphadenopathy:    She has cervical adenopathy.  Neurological: She is alert and oriented to person, place, and time. No cranial nerve deficit.  Skin: Skin is warm and dry. She is not diaphoretic.  Psychiatric: She has a normal mood and affect. Her behavior is normal. Judgment and thought content normal.  Nursing note and vitals reviewed.  Assessment/Plan: 1. Acute non-recurrent pansinusitis Amoxicillin 875mg  bid for 10 days. Recommend OTC medication to relieve symptoms. Rest and increase fluids.  - amoxicillin (AMOXIL) 875 MG tablet; Take 1 tablet (875 mg total) by mouth 2 (two) times daily.  Dispense: 20 tablet; Refill: 0  2. Bacterial conjunctivitis of both eyes polytrim eye drops - inset 1 drop into both eys every 4 hours while  awake for next 10 days. Cool compress in 15 minute intervals to reduce redness and irritation.   3. Chronic obstructive pulmonary disease, unspecified COPD type ([place]) Continue use of inhalers as prescribed.   4. Essential hypertension Stable. Continue bp medication as prescribed.   General Counseling: Jazlyne verbalizes understanding of the findings of todays visit and agrees with plan of treatment. I have discussed any further diagnostic evaluation that may be needed or ordered today. We also reviewed her medications today. she has been encouraged to call the office with any questions or concerns that should arise related to todays visit.  Rest and increase fluids. Continue using OTC medication to control symptoms.   This patient was seen by Leretha Pol, FNP- C in Collaboration with Dr Lavera Guise as a part of collaborative care agreement    Meds ordered this encounter  Medications  . DISCONTD: trimethoprim-polymyxin b (POLYTRIM) ophthalmic solution    Sig: Place 1 drop into both eyes every 4 (four) hours.    Dispense:  10 mL    Refill:  0    Order Specific Question:   Supervising Provider    Answer:   Lavera Guise [[id number]]  .  amoxicillin (AMOXIL) 875 MG tablet    Sig: Take 1 tablet (875 mg total) by mouth 2 (two) times daily.    Dispense:  20 tablet    Refill:  0    Order Specific Question:   Supervising Provider    Answer:   Lavera Guise [[id number]]    Time spent: 15 Minutes

## 2018-02-16 DIAGNOSIS — J0140 Acute pansinusitis, unspecified: Secondary | ICD-10-CM | POA: Insufficient documentation

## 2018-02-16 DIAGNOSIS — H109 Unspecified conjunctivitis: Secondary | ICD-10-CM | POA: Insufficient documentation

## 2018-02-27 ENCOUNTER — Ambulatory Visit (INDEPENDENT_AMBULATORY_CARE_PROVIDER_SITE_OTHER): Payer: 59 | Admitting: Podiatry

## 2018-02-27 ENCOUNTER — Ambulatory Visit (INDEPENDENT_AMBULATORY_CARE_PROVIDER_SITE_OTHER): Payer: Managed Care, Other (non HMO)

## 2018-02-27 ENCOUNTER — Encounter: Payer: Self-pay | Admitting: Podiatry

## 2018-02-27 VITALS — BP 129/87 | HR 79

## 2018-02-27 DIAGNOSIS — M722 Plantar fascial fibromatosis: Secondary | ICD-10-CM

## 2018-02-27 DIAGNOSIS — R52 Pain, unspecified: Secondary | ICD-10-CM

## 2018-02-27 DIAGNOSIS — M205X9 Other deformities of toe(s) (acquired), unspecified foot: Secondary | ICD-10-CM

## 2018-02-27 MED ORDER — MELOXICAM 7.5 MG PO TABS: 7.5000 mg | ORAL_TABLET | ORAL | 0 refills | Status: DC | PRN

## 2018-02-27 NOTE — Progress Notes (Signed)
This patient presents to the office with a severely painful left heel.  She says this heel is painful and stiff when she starts walking, but it improves as she continues to walk.  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  She says this heel pain is becoming more noticeable and is worsening over time.  She has no history of trauma or injury to the foot.  She does state that she has occasional throbbing pain in the big toe of the right foot which she believes is related to the thick nail.  She presents the office today for an evaluation and treatment of her painful feet.  General Appearance  Alert, conversant and in no acute stress.  Vascular  Dorsalis pedis and posterior tibial  pulses are palpable  bilaterally.  Capillary return is within normal limits  bilaterally. Temperature is within normal limits  bilaterally.  Neurologic  Senn-Weinstein monofilament wire test within normal limits  bilaterally. Muscle power within normal limits bilaterally.  Nails normal nails noted with no evidence of bacterial or fungal infection  Orthopedic  No limitations of motion of motion feet .  No crepitus or effusions noted.  Palpable pain noted at the insertion of the plantar fascia left heel.  Functional hallux limitus first MPJ bilaterally.    Skin  normotropic skin with no porokeratosis noted bilaterally.  No signs of infections or ulcers noted.    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  IE X -rays reveal ad an elongated and elevated first metatarsal  B/L.  Discussed this condition with this patient.  Prescribed power step insoles be worn in her bigger  Shoes.  Prescribed Mobic.  Patient decided to hold off on injection therapy into her left heel until she is reevaluated in 3 weeks.   Gardiner Barefoot DPM

## 2018-03-02 ENCOUNTER — Ambulatory Visit
Admission: RE | Admit: 2018-03-02 | Discharge: 2018-03-02 | Disposition: A | Discharge status: Home / Self Care | Payer: Managed Care, Other (non HMO) | Source: Ambulatory Visit | Attending: Radiation Oncology | Admitting: Radiation Oncology

## 2018-03-02 ENCOUNTER — Encounter: Payer: Self-pay | Admitting: Radiation Oncology

## 2018-03-02 ENCOUNTER — Other Ambulatory Visit: Payer: Self-pay

## 2018-03-02 VITALS — BP 134/95 | HR 76 | Temp 97.4°F | Resp 20 | Wt 179.3 lb

## 2018-03-02 DIAGNOSIS — Z79811 Long term (current) use of aromatase inhibitors: Secondary | ICD-10-CM | POA: Diagnosis not present

## 2018-03-02 DIAGNOSIS — Z923 Personal history of irradiation: Secondary | ICD-10-CM | POA: Insufficient documentation

## 2018-03-02 DIAGNOSIS — D0511 Intraductal carcinoma in situ of right breast: Secondary | ICD-10-CM | POA: Insufficient documentation

## 2018-03-02 DIAGNOSIS — Z170 Estrogen receptor positive status [ER+]: Secondary | ICD-10-CM | POA: Diagnosis not present

## 2018-03-02 NOTE — Progress Notes (Signed)
Radiation Oncology Follow up Note  Name: Patricia Le   Date:   03/02/2018 MRN:  [id number] DOB: May 17, 1956    This 62 y.o. female presents to the clinic today for 3.5 year follow-up status post accelerated partial breast radiation to her right breast for ductal carcinoma in situ.  REFERRING PROVIDER: Lavera Guise, MD  HPI: patient is in a 62 year old female now out 3.5 years having completed accelerated partial breast irradiation to her right breast for ductal carcinoma in situ seen today in routine follow-up she is doing well. She specifically denies breast tenderness cough or bone pain..mammograms back in August were BI-RADS 2 benign which I have reviewed.she is currently on Femara tolerating that well without side effect.  COMPLICATIONS OF TREATMENT: none  FOLLOW UP COMPLIANCE: keeps appointments   PHYSICAL EXAM:  BP (!) 134/95   Pulse 76   Temp (!) 97.4 F (36.3 C)   Resp 20   Wt 179 lb 5.5 oz (81.4 kg)   BMI 35.03 kg/m  Lungs are clear to A&P cardiac examination essentially unremarkable with regular rate and rhythm. No dominant mass or nodularity is noted in either breast in 2 positions examined. Incision is well-healed. No axillary or supraclavicular adenopathy is appreciated. Cosmetic result is excellent. Well-developed well-nourished patient in NAD. HEENT reveals PERLA, EOMI, discs not visualized.  Oral cavity is clear. No oral mucosal lesions are identified. Neck is clear without evidence of cervical or supraclavicular adenopathy. Lungs are clear to A&P. Cardiac examination is essentially unremarkable with regular rate and rhythm without murmur rub or thrill. Abdomen is benign with no organomegaly or masses noted. Motor sensory and DTR levels are equal and symmetric in the upper and lower extremities. Cranial nerves II through XII are grossly intact. Proprioception is intact. No peripheral adenopathy or edema is identified. No motor or sensory levels are noted. Crude visual  fields are within normal range.  RADIOLOGY RESULTS: mammograms are reviewed and compatible with the above-stated findings  PLAN: resent time she continues to do well with no evidence of disease. I've asked to see her back in 1 year for follow-up and then will discontinue follow-up care. Patient is a rescheduled for follow-up mammograms this August. Patient is to call with any concerns.  I would like to take this opportunity to thank you for allowing me to participate in the care of your patient.Patricia Filbert, MD

## 2018-03-13 ENCOUNTER — Ambulatory Visit: Payer: Managed Care, Other (non HMO) | Admitting: Podiatry

## 2018-03-26 ENCOUNTER — Other Ambulatory Visit: Payer: Self-pay | Admitting: Podiatry

## 2018-04-05 ENCOUNTER — Other Ambulatory Visit: Payer: Self-pay | Admitting: Podiatry

## 2018-04-05 DIAGNOSIS — M722 Plantar fascial fibromatosis: Secondary | ICD-10-CM

## 2018-04-10 ENCOUNTER — Other Ambulatory Visit: Payer: Self-pay | Admitting: Internal Medicine

## 2018-04-26 ENCOUNTER — Other Ambulatory Visit: Payer: Self-pay

## 2018-04-26 DIAGNOSIS — D0511 Intraductal carcinoma in situ of right breast: Secondary | ICD-10-CM

## 2018-05-29 ENCOUNTER — Encounter: Payer: Self-pay | Admitting: *Deleted

## 2018-05-29 NOTE — Progress Notes (Signed)
Prescription refill for Letrozole 2.5 mg tab take one tablet by mouth daily #90, 4 refills faxed to [facility] (Fax: [phone]) today.   Old patient of Dr. Jamal Collin. Refill authorized by Dr. Bary Castilla.

## 2018-06-01 ENCOUNTER — Ambulatory Visit
Admission: RE | Admit: 2018-06-01 | Discharge: 2018-06-01 | Disposition: A | Discharge status: Home / Self Care | Payer: Managed Care, Other (non HMO) | Source: Ambulatory Visit | Attending: General Surgery | Admitting: General Surgery

## 2018-06-01 DIAGNOSIS — D0511 Intraductal carcinoma in situ of right breast: Secondary | ICD-10-CM

## 2018-06-08 ENCOUNTER — Encounter: Payer: Self-pay | Admitting: General Surgery

## 2018-06-08 ENCOUNTER — Ambulatory Visit (INDEPENDENT_AMBULATORY_CARE_PROVIDER_SITE_OTHER): Payer: Managed Care, Other (non HMO) | Admitting: General Surgery

## 2018-06-08 VITALS — BP 128/70 | HR 76 | Resp 12 | Ht 60.0 in | Wt 183.0 lb

## 2018-06-08 DIAGNOSIS — K449 Diaphragmatic hernia without obstruction or gangrene: Secondary | ICD-10-CM

## 2018-06-08 DIAGNOSIS — D0511 Intraductal carcinoma in situ of right breast: Secondary | ICD-10-CM

## 2018-06-08 NOTE — Patient Instructions (Signed)
The patient has been asked to return to the office in one year with a bilateral diagnostic mammogram.The patient is aware to call back for any questions or concerns. 

## 2018-06-08 NOTE — Progress Notes (Signed)
Patient ID: Patricia Le, female   DOB: August 22, 1956, 62 y.o.   MRN: [id number]  Chief Complaint  Patient presents with  . Follow-up    HPI Patricia Le is a 62 y.o. female who presents for a breast evaluation. The most recent mammogram was done on 06/01/2018.  Patient does perform regular self breast checks and gets regular mammograms done.  Patient states she has a hiatal hernia and states its hard to breathe after meals.   She reports weight gain secondary to having developed plantar fasciitis and being unable to participate in walking.  HPI  Past Medical History:  Diagnosis Date  . Anemia   . 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 Cancer [facility])    DCIS right breast  . Colon polyp   . GERD (gastroesophageal reflux disease)   . Hiatal hernia   . Hyperlipidemia   . Hypertension   . Restless leg syndrome     Past Surgical History:  Procedure Laterality Date  . ABDOMINAL HYSTERECTOMY    . BACK SURGERY    . BREAST BIOPSY Right 2015   DCIS  . BREAST LUMPECTOMY Right 2015   DCIS with Mammosite  . BREAST MAMMOSITE Right 07-02-14  . BREAST SURGERY Right 06-06-14   Wide excision of Right breast DCIS  . CESAREAN SECTION    . CHOLECYSTECTOMY    . COLONOSCOPY     over 10 yrs ago/ Dr Patricia Le  . COLONOSCOPY  11-20-14   Dr Patricia Le hyperplastic polyp  . TONSILLECTOMY    . TUBAL LIGATION      Family History  Problem Relation Age of Onset  . Diabetes Mother   . Cancer Father 30       melanoma  . Breast cancer Neg Hx     Social History Social History   Tobacco Use  . Smoking status: Former Smoker    Packs/day: 1.00    Years: 20.00    Pack years: 20.00    Types: Cigarettes  . Smokeless tobacco: Never Used  Substance Use Topics  . Alcohol use: Yes    Alcohol/week: 0.0 standard drinks  . Drug use: Not on file    Allergies  Allergen Reactions  . No Known Allergies     Current Outpatient Medications  Medication Sig Dispense Refill  . budesonide-formoterol (SYMBICORT)  80-4.5 MCG/ACT inhaler Inhale 2 puffs into the lungs daily as needed.    . cetirizine (ZYRTEC) 10 MG tablet TAKE 1 BY MOUTH DAILY AT BEDTIME  3  . fluticasone (FLONASE) 50 MCG/ACT nasal spray 2 SPRAYS IN BOTH NOSTRILS EVERY DAY FOR RHINITIS  3  . gabapentin (NEURONTIN) 300 MG capsule Take 300 mg by mouth 3 (three) times daily.     Marland Kitchen letrozole (FEMARA) 2.5 MG tablet Take 1 tablet by mouth  daily 90 tablet 3  . meloxicam (MOBIC) 7.5 MG tablet Take 1 tablet (7.5 mg total) by mouth as needed for pain. 30 tablet 0  . metoprolol tartrate (LOPRESSOR) 25 MG tablet TAKE 1 TABLET BY MOUTH  TWICE A DAY 180 tablet 2  . omeprazole (PRILOSEC) 40 MG capsule Take 40 mg by mouth daily.     Marland Kitchen triamcinolone cream (KENALOG) 0.1 % APPLY TO AFFECTED AREA TWICE A DAY FOR 10 DAYS  1   No current facility-administered medications for this visit.     Review of Systems Review of Systems  Constitutional: Negative.   Respiratory: Negative.   Cardiovascular: Negative.     Blood pressure  128/70, pulse 76, resp. rate 12, height 5' (1.524 m), weight 183 lb (83 kg).  The patient's weight is up 8 pounds from last year.  Physical Exam Physical Exam  Constitutional: She is oriented to person, place, and time. She appears well-developed and well-nourished.  Eyes: Conjunctivae are normal. No scleral icterus.  Neck: Neck supple.  Cardiovascular: Normal rate, regular rhythm and normal heart sounds.  Pulmonary/Chest: Effort normal and breath sounds normal. Right breast exhibits no inverted nipple, no mass, no nipple discharge, no skin change and no tenderness. Left breast exhibits no inverted nipple, no mass, no nipple discharge, no skin change and no tenderness.    Lymphadenopathy:    She has no cervical adenopathy.    She has no axillary adenopathy.  Neurological: She is alert and oriented to person, place, and time.  Skin: Skin is warm and dry.    Data Reviewed April 15, 2014 CT of the abdomen and pelvis: CLINICAL  DATA:  Mid abdominal pain and gas. With intermittent  diarrhea  IMPRESSION:  1. The stomach is partially intrathoracic. The does not appear  volvulized. No bowel extends into the thoracic cavity. There is no  small or large bowel obstruction or inflammatory change. There is a  large stool burden in the right colon.  2. The patient has undergone previous cholecystectomy with resultant  minimal intrahepatic ductal dilation.  3. There is no acute urinary tract abnormality.  4. A cystic-appearing right adnexal structure is present. Given its  size and the patient's age, pelvic ultrasound is recommended.   June 01, 2018 bilateral diagnostic mammograms reviewed.  BI-RADS-2.   Assessment    Doing well now 4 years after management of DCIS involving the right breast.  Candidate for bone density unless completed at an outside facility within the past 2 years. Massive hiatal hernia with shortness of breath after eating.    Plan   The patient has been asked to return to the office in one year with a bilateral diagnostic mammogram.The patient is aware to call back for any questions or concerns.  The patient warrants [facility] referral for repair of her massive hiatal hernia.  Whether she would be a candidate for bariatric procedure at the same setting as she has shown an inability to maintain or achieve any weight loss can be assessed at that time.  HPI, Physical Exam, Assessment and Plan have been scribed under the direction and in the presence of Patricia Ard, MD.  Patricia Le, CMA  I have completed the exam and reviewed the above documentation for accuracy and completeness.  I agree with the above.  Haematologist has been used and any errors in dictation or transcription are unintentional.  Patricia Le, M.D., F.A.C.S.  Patricia Le Coty Student 06/09/2018, 7:16 AM

## 2018-06-09 ENCOUNTER — Ambulatory Visit (INDEPENDENT_AMBULATORY_CARE_PROVIDER_SITE_OTHER): Payer: Managed Care, Other (non HMO) | Admitting: Nurse Practitioner

## 2018-06-09 ENCOUNTER — Encounter: Payer: Self-pay | Admitting: Nurse Practitioner

## 2018-06-09 VITALS — BP 130/84 | HR 90 | Resp 16 | Ht 60.0 in | Wt 182.0 lb

## 2018-06-09 DIAGNOSIS — I10 Essential (primary) hypertension: Secondary | ICD-10-CM | POA: Diagnosis not present

## 2018-06-09 DIAGNOSIS — Z6835 Body mass index (BMI) 35.0-35.9, adult: Secondary | ICD-10-CM

## 2018-06-09 DIAGNOSIS — J449 Chronic obstructive pulmonary disease, unspecified: Secondary | ICD-10-CM

## 2018-06-09 DIAGNOSIS — K449 Diaphragmatic hernia without obstruction or gangrene: Secondary | ICD-10-CM | POA: Insufficient documentation

## 2018-06-09 NOTE — Progress Notes (Signed)
[facility] [place], [place] [address]  Internal MEDICINE  Office Visit Note  Patient Name: Patricia Le  [id number]  [id number]  Date of Service: 06/18/2018  Chief Complaint  Patient presents with  . Hyperlipidemia  . Hypertension  . Shortness of Breath    The patient has had a five pound weight gain since her last visit. She has to have a form filled out, stating that we have a plan for weight management in order to her to avoid an increase in her insurance premiums. She is having a hard time with exercise at this time. She has developed plantar fasciitis. Also has flat feet. Orthoptics make feet more tender. She is trying different shoes to help this condition.  Has moderate to sever hiatal hernia. This was diagnosed by CT back in 2015. Stomach was intrathoracic without evidence of volvulizing. She does feel like this might be getting worse. Gets very full and bloated feeling very quickly and shortness of breat with exertion is worse.       Current Medication: Outpatient Encounter Medications as of 06/09/2018  Medication Sig Note  . budesonide-formoterol (SYMBICORT) 80-4.5 MCG/ACT inhaler Inhale 2 puffs into the lungs daily as needed.   . cetirizine (ZYRTEC) 10 MG tablet TAKE 1 BY MOUTH DAILY AT BEDTIME   . fluticasone (FLONASE) 50 MCG/ACT nasal spray 2 SPRAYS IN BOTH NOSTRILS EVERY DAY FOR RHINITIS   . gabapentin (NEURONTIN) 300 MG capsule Take 300 mg by mouth 3 (three) times daily.  05/08/2014: Received from: External Pharmacy  . letrozole (FEMARA) 2.5 MG tablet Take 1 tablet by mouth  daily   . meloxicam (MOBIC) 7.5 MG tablet Take 1 tablet (7.5 mg total) by mouth as needed for pain.   . metoprolol tartrate (LOPRESSOR) 25 MG tablet TAKE 1 TABLET BY MOUTH  TWICE A DAY   . omeprazole (PRILOSEC) 40 MG capsule Take 40 mg by mouth daily.  05/08/2014: Received from: External Pharmacy  . triamcinolone cream (KENALOG) 0.1 % APPLY TO AFFECTED AREA TWICE A DAY  FOR 10 DAYS 05/20/2016: Received from: External Pharmacy   No facility-administered encounter medications on file as of 06/09/2018.     Surgical History: Past Surgical History:  Procedure Laterality Date  . ABDOMINAL HYSTERECTOMY    . BACK SURGERY    . BREAST BIOPSY Right 2015   DCIS  . BREAST LUMPECTOMY Right 2015   DCIS with Mammosite  . BREAST MAMMOSITE Right 07-02-14  . BREAST SURGERY Right 06-06-14   Wide excision of Right breast DCIS  . CESAREAN SECTION    . CHOLECYSTECTOMY    . COLONOSCOPY     over 10 yrs ago/ Dr Kristopher Glee  . COLONOSCOPY  11-20-14   Dr Jamal Collin hyperplastic polyp  . TONSILLECTOMY    . TUBAL LIGATION      Medical History: Past Medical History:  Diagnosis Date  . Anemia   . 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 Cancer [facility])    DCIS right breast  . Colon polyp   . GERD (gastroesophageal reflux disease)   . Hiatal hernia   . Hyperlipidemia   . Hypertension   . Restless leg syndrome     Family History: Family History  Problem Relation Age of Onset  . Diabetes Mother   . Cancer Father 4       melanoma  . Breast cancer Neg Hx     Social History   Socioeconomic History  . Marital status: Divorced  Spouse name: Not on file  . Number of children: Not on file  . Years of education: Not on file  . Highest education level: Not on file  Occupational History  . Not on file  Social Needs  . Financial resource strain: Not on file  . Food insecurity:    Worry: Not on file    Inability: Not on file  . Transportation needs:    Medical: Not on file    Non-medical: Not on file  Tobacco Use  . Smoking status: Former Smoker    Packs/day: 1.00    Years: 20.00    Pack years: 20.00    Types: Cigarettes  . Smokeless tobacco: Never Used  Substance and Sexual Activity  . Alcohol use: Yes    Alcohol/week: 0.0 standard drinks  . Drug use: Not on file  . Sexual activity: Not on file  Lifestyle  . Physical activity:    Days per week: Not on file     Minutes per session: Not on file  . Stress: Not on file  Relationships  . Social connections:    Talks on phone: Not on file    Gets together: Not on file    Attends religious service: Not on file    Active member of club or organization: Not on file    Attends meetings of clubs or organizations: Not on file    Relationship status: Not on file  . Intimate partner violence:    Fear of current or ex partner: Not on file    Emotionally abused: Not on file    Physically abused: Not on file    Forced sexual activity: Not on file  Other Topics Concern  . Not on file  Social History Narrative  . Not on file      Review of Systems  Constitutional: Positive for fatigue. Negative for activity change, chills and fever.       Five pound weight gain since most recent visit  HENT: Negative for congestion, ear pain, postnasal drip, rhinorrhea, sinus pain, sneezing, sore throat and voice change.   Eyes: Negative for pain and itching.  Respiratory: Positive for wheezing. Negative for cough.        Wheezing is intermittent.  Cardiovascular: Positive for palpitations. Negative for chest pain.  Gastrointestinal: Negative for constipation, diarrhea, nausea and vomiting.  Endocrine: Negative for cold intolerance, heat intolerance, polydipsia, polyphagia and polyuria.       Intermittent hot flashes due to hormonal therapy for breast cancer.   Genitourinary: Negative.   Musculoskeletal: Negative for arthralgias, back pain and myalgias.  Skin: Negative for rash.  Allergic/Immunologic: Positive for environmental allergies.  Neurological: Negative for dizziness and headaches.  Hematological: Negative for adenopathy.  Psychiatric/Behavioral: Negative for dysphoric mood and sleep disturbance. The patient is not nervous/anxious.     Today's Vitals   06/09/18 1503  BP: 130/84  Pulse: 90  Resp: 16  SpO2: 96%  Weight: 182 lb (82.6 kg)  Height: 5' (1.524 m)    Physical Exam  Constitutional: She  is oriented to person, place, and time. She appears well-developed and well-nourished. No distress.  HENT:  Head: Normocephalic and atraumatic.  Mouth/Throat: Oropharynx is clear and moist. No oropharyngeal exudate.  Eyes: Pupils are equal, round, and reactive to light. EOM are normal.  Neck: Normal range of motion. Neck supple. No JVD present. No tracheal deviation present. No thyromegaly present.  Cardiovascular: Normal rate, regular rhythm and normal heart sounds. Exam reveals no gallop and  no friction rub.  No murmur heard. Pulmonary/Chest: Effort normal and breath sounds normal. No respiratory distress. She has no wheezes. She has no rales. She exhibits no tenderness.  Abdominal: Soft. Bowel sounds are normal. There is no tenderness.  Musculoskeletal: Normal range of motion.  Lymphadenopathy:    She has no cervical adenopathy.  Neurological: She is alert and oriented to person, place, and time. No cranial nerve deficit.  Skin: Skin is warm and dry. Capillary refill takes less than 2 seconds. She is not diaphoretic.  Psychiatric: She has a normal mood and affect. Her behavior is normal. Judgment and thought content normal.  Nursing note and vitals reviewed.  Assessment/Plan: 1. Essential hypertension Overall, stable. Continue bp medication as prescribed .  2. Chronic obstructive pulmonary disease, unspecified COPD type ([place]) Generally doing well. Continue Symbicort twice daily. Use rescue inhaler as needed and as prescribed .  3. Adult BMI 35.0-35.9 kg/sq m Surveyor, quantity form was filled out, stating that she would limit calorie intake to 1200/1500 calories per day and gradually increase her regular exercise as tolerated. Waiver form was returned to the patient at the time of visit.   General Counseling: Renesha verbalizes understanding of the findings of todays visit and agrees with plan of treatment. I have discussed any further diagnostic evaluation that may be needed or ordered  today. We also reviewed her medications today. she has been encouraged to call the office with any questions or concerns that should arise related to todays visit.  Obesity Counseling: Risk Assessment: An assessment of behavioral risk factors was made today and includes lack of exercise sedentary lifestyle, lack of portion control and poor dietary habits.  Risk Modification Advice: She was counseled on portion control guidelines. Restricting daily caloric intake to 1200-1500. The detrimental long term effects of obesity on her health and ongoing poor compliance was also discussed with the patient.  This patient was seen by Leretha Pol FNP Collaboration with Dr Lavera Guise as a part of collaborative care agreement  Time spent: 25 Minutes      Dr Lavera Guise Internal medicine

## 2018-06-15 ENCOUNTER — Other Ambulatory Visit: Payer: Self-pay

## 2018-06-15 DIAGNOSIS — Z780 Asymptomatic menopausal state: Secondary | ICD-10-CM

## 2018-06-18 DIAGNOSIS — Z6835 Body mass index (BMI) 35.0-35.9, adult: Secondary | ICD-10-CM | POA: Insufficient documentation

## 2018-06-19 ENCOUNTER — Other Ambulatory Visit: Payer: Self-pay

## 2018-06-19 DIAGNOSIS — K449 Diaphragmatic hernia without obstruction or gangrene: Secondary | ICD-10-CM

## 2018-07-03 ENCOUNTER — Telehealth: Payer: Self-pay

## 2018-07-03 NOTE — Telephone Encounter (Signed)
Patient will schedule a bone density test. She is amendable to going to [facility] for hiatal hernia. Referral has been sent to [facility] for Dr Doreen Beam. Patient given the contact information and told that they would be in contact with her in the next week.

## 2018-07-03 NOTE — Telephone Encounter (Signed)
-----   Message from Robert Bellow, MD sent at 06/19/2018 10:12 AM EDT ----- Cammie Sickle, MD ----- Message ----- From: Lesly Rubenstein, LPN Sent: 06/09/1659   8:47 AM EDT To: Robert Bellow, MD  Still working in ER/PR. Bone density scheduled for 07/05/18.   Wants to go to [facility] for hiatal hernia, any recommendations on physician there?  ----- Message ----- From: Robert Bellow, MD Sent: 06/09/2018   7:21 AM EDT To: Lesly Rubenstein, LPN  Please see if you can find the ER/PR results from the 2015 breast biopsy.  Contact the patient and see if she is had a bone density completed within the last 2 years, if not this should be scheduled.  Notify her I reviewed her CT scan from 2015 and she indeed does have a massive hiatal hernia that warrants intervention.  I do not think anyone locally should handle this, and would recommend referral to [facility] or [facility].  On the other physicians at [facility], but would be glad to make referral to either institution.  See if she is amenable.  Thank you

## 2018-07-05 ENCOUNTER — Telehealth: Payer: Self-pay

## 2018-07-05 ENCOUNTER — Ambulatory Visit
Admission: RE | Admit: 2018-07-05 | Discharge: 2018-07-05 | Disposition: A | Discharge status: Home / Self Care | Payer: Managed Care, Other (non HMO) | Source: Ambulatory Visit | Attending: General Surgery | Admitting: General Surgery

## 2018-07-05 DIAGNOSIS — Z780 Asymptomatic menopausal state: Secondary | ICD-10-CM | POA: Diagnosis present

## 2018-07-05 NOTE — Telephone Encounter (Signed)
-----   Message from Robert Bellow, MD sent at 07/05/2018  2:46 PM EDT ----- Please notify the patient that her bone density test shows some thinning.  Be sure she is taking calcium and Vit D. Has she seen MD at [facility] re: Hiatal hernia repair yet?  ----- Message ----- From: Interface, Rad Results In Sent: 07/05/2018  11:55 AM EDT To: Robert Bellow, MD

## 2018-07-06 NOTE — Telephone Encounter (Signed)
Patient states that she is taking vitamin D but not Calcium. Instructed to start taking a Calcium supplement, ideally twice a day for better absorption. Patient notified of Bone Density scan results and will start Calcium supplement.

## 2018-07-14 NOTE — Telephone Encounter (Signed)
Patient states that she wants to wait until the first of next year to see Dr Cammie Sickle for her hiatal hernia as her insurance will be changing. She has their contact information at [facility].

## 2018-07-18 ENCOUNTER — Encounter: Payer: Self-pay | Admitting: Nurse Practitioner

## 2018-07-18 ENCOUNTER — Ambulatory Visit (INDEPENDENT_AMBULATORY_CARE_PROVIDER_SITE_OTHER): Payer: Managed Care, Other (non HMO) | Admitting: Nurse Practitioner

## 2018-07-18 VITALS — BP 123/82 | HR 93 | Resp 16 | Ht 59.0 in | Wt 182.4 lb

## 2018-07-18 DIAGNOSIS — I10 Essential (primary) hypertension: Secondary | ICD-10-CM | POA: Diagnosis not present

## 2018-07-18 DIAGNOSIS — J449 Chronic obstructive pulmonary disease, unspecified: Secondary | ICD-10-CM

## 2018-07-18 DIAGNOSIS — R300 Dysuria: Secondary | ICD-10-CM | POA: Insufficient documentation

## 2018-07-18 DIAGNOSIS — D0511 Intraductal carcinoma in situ of right breast: Secondary | ICD-10-CM

## 2018-07-18 DIAGNOSIS — L209 Atopic dermatitis, unspecified: Secondary | ICD-10-CM | POA: Diagnosis not present

## 2018-07-18 DIAGNOSIS — Z0001 Encounter for general adult medical examination with abnormal findings: Secondary | ICD-10-CM

## 2018-07-18 MED ORDER — TRIAMCINOLONE ACETONIDE 0.1 % EX CREA: TOPICAL_CREAM | CUTANEOUS | 3 refills | Status: DC

## 2018-07-18 NOTE — Progress Notes (Signed)
[facility] [place], [place] [address]  Internal MEDICINE  Office Visit Note  Patient Name: Patricia Le  [id number]  [id number]  Date of Service: 07/18/2018   Pt is here for routine health maintenance examination   Chief Complaint  Patient presents with  . Annual Exam    6 month physical  . Hypertension  . Hyperlipidemia     The patient is here for routine health exam. She states that she is feeling well. She had her flu chot yesterday. Did speak with Dry. Byrnett about hiatal hernia, found on CT sca a few years ago. He has recommended she see a Psychologist, sport and exercise in [place] or [place]. Will wait until after the fist of the year to seek this out. She did have routine, fasting labs ordered at her last visit, bot has not had them taken yet . She states that she does have some intermittent asthma. Uses her rescue inhaler when needed. This does resolve the symptoms.    Current Medication: Outpatient Encounter Medications as of 07/18/2018  Medication Sig Note  . budesonide-formoterol (SYMBICORT) 80-4.5 MCG/ACT inhaler Inhale 2 puffs into the lungs daily as needed.   . cetirizine (ZYRTEC) 10 MG tablet TAKE 1 BY MOUTH DAILY AT BEDTIME   . fluticasone (FLONASE) 50 MCG/ACT nasal spray 2 SPRAYS IN BOTH NOSTRILS EVERY DAY FOR RHINITIS   . gabapentin (NEURONTIN) 300 MG capsule Take 300 mg by mouth 3 (three) times daily.  05/08/2014: Received from: External Pharmacy  . letrozole (FEMARA) 2.5 MG tablet Take 1 tablet by mouth  daily   . meloxicam (MOBIC) 7.5 MG tablet Take 1 tablet (7.5 mg total) by mouth as needed for pain.   . metoprolol tartrate (LOPRESSOR) 25 MG tablet TAKE 1 TABLET BY MOUTH  TWICE A DAY   . omeprazole (PRILOSEC) 40 MG capsule Take 40 mg by mouth daily.  05/08/2014: Received from: External Pharmacy  . triamcinolone cream (KENALOG) 0.1 % Apply to all affected areas twice daily as needed   . [DISCONTINUED] triamcinolone cream (KENALOG) 0.1 % APPLY TO  AFFECTED AREA TWICE A DAY FOR 10 DAYS 05/20/2016: Received from: External Pharmacy   No facility-administered encounter medications on file as of 07/18/2018.     Surgical History: Past Surgical History:  Procedure Laterality Date  . ABDOMINAL HYSTERECTOMY    . BACK SURGERY    . BREAST BIOPSY Right 2015   DCIS  . BREAST LUMPECTOMY Right 2015   DCIS with Mammosite  . BREAST MAMMOSITE Right 07-02-14  . BREAST SURGERY Right 06-06-14   Wide excision of Right breast DCIS  . CESAREAN SECTION    . CHOLECYSTECTOMY    . COLONOSCOPY     over 10 yrs ago/ Dr Kristopher Glee  . COLONOSCOPY  11-20-14   Dr Jamal Collin hyperplastic polyp  . TONSILLECTOMY    . TUBAL LIGATION      Medical History: Past Medical History:  Diagnosis Date  . Anemia   . Breast cancer ([place]) 2015   DCIS  . Cancer [facility])    DCIS right breast  . Colon polyp   . GERD (gastroesophageal reflux disease)   . Hiatal hernia   . Hyperlipidemia   . Hypertension   . Restless leg syndrome     Family History: Family History  Problem Relation Age of Onset  . Diabetes Mother   . Cancer Father 45       melanoma  . Breast cancer Neg Hx  Review of Systems  Constitutional: Negative for activity change, chills, fatigue and unexpected weight change.  HENT: Negative for congestion, postnasal drip, rhinorrhea, sneezing and sore throat.   Eyes: Negative for redness.  Respiratory: Positive for wheezing. Negative for cough, chest tightness and shortness of breath.        Intermittent wheezing which is relieved by using her rescue inhaler   Cardiovascular: Negative for chest pain and palpitations.  Gastrointestinal: Negative for abdominal pain, constipation, diarrhea, nausea and vomiting.  Endocrine: Positive for heat intolerance. Negative for cold intolerance, polydipsia, polyphagia and polyuria.  Genitourinary: Negative for dysuria and frequency.  Musculoskeletal: Negative for arthralgias, back pain, joint swelling and neck pain.    Skin: Negative for rash.  Allergic/Immunologic: Positive for environmental allergies.  Neurological: Negative for dizziness, tremors, numbness and headaches.  Hematological: Negative for adenopathy. Does not bruise/bleed easily.  Psychiatric/Behavioral: Negative for behavioral problems (Depression), sleep disturbance and suicidal ideas. The patient is not nervous/anxious.      Vital Signs: BP 123/82 (BP Location: Right Arm, Patient Position: Sitting, Cuff Size: Normal)   Pulse 93   Resp 16   Ht 4\' 11"  (1.499 m)   Wt 182 lb 6.4 oz (82.7 kg)   SpO2 98%   BMI 36.84 kg/m    Physical Exam  Constitutional: She is oriented to person, place, and time. She appears well-developed and well-nourished. No distress.  HENT:  Head: Normocephalic and atraumatic.  Nose: Nose normal.  Mouth/Throat: Oropharynx is clear and moist. No oropharyngeal exudate.  Eyes: Pupils are equal, round, and reactive to light. Conjunctivae and EOM are normal.  Neck: Normal range of motion. Neck supple. No JVD present. Carotid bruit is not present. No tracheal deviation present. No thyromegaly present.  Cardiovascular: Normal rate, regular rhythm, normal heart sounds and intact distal pulses. Exam reveals no gallop and no friction rub.  No murmur heard. Pulmonary/Chest: Effort normal and breath sounds normal. No respiratory distress. She has no wheezes. She has no rales. She exhibits no tenderness. Right breast exhibits no inverted nipple, no mass, no nipple discharge, no skin change and no tenderness. Left breast exhibits no inverted nipple, no nipple discharge, no skin change and no tenderness.    Abdominal: Soft. Bowel sounds are normal. There is no tenderness.  Musculoskeletal: Normal range of motion.  Lymphadenopathy:    She has no cervical adenopathy.  Neurological: She is alert and oriented to person, place, and time. No cranial nerve deficit.  Skin: Skin is warm and dry. She is not diaphoretic.   Psychiatric: She has a normal mood and affect. Her behavior is normal. Judgment and thought content normal.  Nursing note and vitals reviewed.  Assessment/Plan: 1. Encounter for general adult medical examination with abnormal findings Annual health maintenance exam today.  2. Essential hypertension Stab;e/ continue bp medication as prescribed.   3. Chronic obstructive pulmonary disease, unspecified COPD type ([place]) Well managed. Continue to use maintenance inhaler twice daily and rescue inhaler as needed and as prescribed   4. 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   6. Dysuria - UA/M w/rflx Culture, Routine  General Counseling: Judy verbalizes understanding of the findings of todays visit and agrees with plan of treatment. I have discussed any further diagnostic evaluation that may be needed or ordered  today. We also reviewed her medications today. she has been encouraged to call the office with any questions or concerns that should arise related to todays visit.    Counseling:  Hypertension Counseling:   The following hypertensive lifestyle modification were recommended and discussed:  1. Limiting alcohol intake to less than 1 oz/day of ethanol:(24 oz of beer or 8 oz of wine or 2 oz of 100-proof whiskey). 2. Take baby ASA 81 mg daily. 3. Importance of regular aerobic exercise and losing weight. 4. Reduce dietary saturated fat and cholesterol intake for overall cardiovascular health. 5. Maintaining adequate dietary potassium, calcium, and magnesium intake. 6. Regular monitoring of the blood pressure. 7. Reduce sodium intake to less than 100 mmol/day (less than 2.3 gm of sodium or less than 6 gm of sodium choride)   This patient was seen by [place] with Dr Lavera Guise as a part of collaborative care agreement  Orders Placed This Encounter  Procedures  . UA/M w/rflx Culture, Routine    Meds ordered this encounter  Medications  . triamcinolone cream (KENALOG) 0.1 %    Sig: Apply to all affected areas twice daily as needed    Dispense:  80 g    Refill:  3    Order Specific Question:   Supervising Provider    Answer:   Lavera Guise [[id number]]    Time spent: [place], MD  Internal Medicine

## 2018-07-20 LAB — UA/M W/RFLX CULTURE, ROUTINE
Bilirubin, UA: NEGATIVE
GLUCOSE, UA: NEGATIVE
Ketones, UA: NEGATIVE
Nitrite, UA: NEGATIVE
PH UA: 5 (ref 5.0–7.5)
Protein, UA: NEGATIVE
RBC, UA: NEGATIVE
SPEC GRAV UA: 1.024 (ref 1.005–1.030)
Urobilinogen, Ur: 0.2 mg/dL (ref 0.2–1.0)

## 2018-07-20 LAB — MICROSCOPIC EXAMINATION
Bacteria, UA: NONE SEEN
CASTS: NONE SEEN /LPF

## 2018-07-20 LAB — URINE CULTURE, REFLEX

## 2018-08-16 ENCOUNTER — Encounter: Payer: Self-pay | Admitting: General Surgery

## 2018-09-18 ENCOUNTER — Ambulatory Visit (INDEPENDENT_AMBULATORY_CARE_PROVIDER_SITE_OTHER): Payer: Managed Care, Other (non HMO) | Admitting: Adult Health

## 2018-09-18 ENCOUNTER — Encounter: Payer: Self-pay | Admitting: Adult Health

## 2018-09-18 VITALS — BP 147/102 | HR 71 | Temp 98.3°F | Resp 16 | Ht 59.0 in | Wt 178.4 lb

## 2018-09-18 DIAGNOSIS — J449 Chronic obstructive pulmonary disease, unspecified: Secondary | ICD-10-CM

## 2018-09-18 DIAGNOSIS — J069 Acute upper respiratory infection, unspecified: Secondary | ICD-10-CM

## 2018-09-18 DIAGNOSIS — R6889 Other general symptoms and signs: Secondary | ICD-10-CM

## 2018-09-18 DIAGNOSIS — I10 Essential (primary) hypertension: Secondary | ICD-10-CM | POA: Diagnosis not present

## 2018-09-18 LAB — POCT INFLUENZA A/B
INFLUENZA B, POC: NEGATIVE
Influenza A, POC: NEGATIVE

## 2018-09-18 MED ORDER — LEVOFLOXACIN 500 MG PO TABS: 500.0000 mg | ORAL_TABLET | Freq: Every day | ORAL | 0 refills | Status: DC

## 2018-09-18 NOTE — Progress Notes (Signed)
[facility] [place], [place] [address]  Internal MEDICINE  Office Visit Note  Patient Name: Patricia Le  [id number]  [id number]  Date of Service: 09/18/2018  Chief Complaint  Patient presents with  . Cough    last 2 weeks, with fever and aches  . Nasal Congestion     HPI Pt is here for a sick visit. She reports feeling fatigued for a few days.  She then noticed she had a fever, and started coughing profusely.  She was taking OTC medications.  This has now been going on for two weeks, with intermittent fever aches.       Current Medication:  Outpatient Encounter Medications as of 09/18/2018  Medication Sig Note  . budesonide-formoterol (SYMBICORT) 80-4.5 MCG/ACT inhaler Inhale 2 puffs into the lungs daily as needed.   . cetirizine (ZYRTEC) 10 MG tablet TAKE 1 BY MOUTH DAILY AT BEDTIME   . fluticasone (FLONASE) 50 MCG/ACT nasal spray 2 SPRAYS IN BOTH NOSTRILS EVERY DAY FOR RHINITIS   . gabapentin (NEURONTIN) 300 MG capsule Take 300 mg by mouth 3 (three) times daily.  05/08/2014: Received from: External Pharmacy  . letrozole (FEMARA) 2.5 MG tablet Take 1 tablet by mouth  daily   . meloxicam (MOBIC) 7.5 MG tablet Take 1 tablet (7.5 mg total) by mouth as needed for pain.   . metoprolol tartrate (LOPRESSOR) 25 MG tablet TAKE 1 TABLET BY MOUTH  TWICE A DAY   . omeprazole (PRILOSEC) 40 MG capsule Take 40 mg by mouth daily.  05/08/2014: Received from: External Pharmacy  . triamcinolone cream (KENALOG) 0.1 % Apply to all affected areas twice daily as needed   . levofloxacin (LEVAQUIN) 500 MG tablet Take 1 tablet (500 mg total) by mouth daily.    No facility-administered encounter medications on file as of 09/18/2018.       Medical History: Past Medical History:  Diagnosis Date  . Anemia   . Breast cancer ([place]) 2015   9 mm intermediate grade DCIS; ER: 90%, PR: 1-15%  . Cancer [facility])    DCIS right breast  . Colon polyp   . GERD (gastroesophageal  reflux disease)   . Hiatal hernia   . Hyperlipidemia   . Hypertension   . Restless leg syndrome      Vital Signs: BP (!) 147/102   Pulse 71   Temp 98.3 F (36.8 C)   Resp 16   Ht 4\' 11"  (1.499 m)   Wt 178 lb 6.4 oz (80.9 kg)   SpO2 97%   BMI 36.03 kg/m    Review of Systems  Constitutional: Positive for chills, fatigue and fever. Negative for unexpected weight change.  HENT: Positive for sore throat. Negative for congestion, rhinorrhea and sneezing.   Eyes: Negative for photophobia, pain and redness.  Respiratory: Positive for cough and chest tightness. Negative for shortness of breath.   Cardiovascular: Negative for chest pain and palpitations.  Gastrointestinal: Negative for abdominal pain, constipation, diarrhea, nausea and vomiting.  Endocrine: Negative.   Genitourinary: Negative for dysuria and frequency.  Musculoskeletal: Negative for arthralgias, back pain, joint swelling and neck pain.  Skin: Negative for rash.  Allergic/Immunologic: Negative.   Neurological: Negative for tremors and numbness.  Hematological: Negative for adenopathy. Does not bruise/bleed easily.  Psychiatric/Behavioral: Negative for behavioral problems and sleep disturbance. The patient is not nervous/anxious.     Physical Exam Vitals signs and nursing note reviewed.  Constitutional:      General: She is not  in acute distress.    Appearance: She is well-developed. She is not diaphoretic.  HENT:     Head: Normocephalic and atraumatic.     Mouth/Throat:     Pharynx: No oropharyngeal exudate.  Eyes:     Pupils: Pupils are equal, round, and reactive to light.  Neck:     Musculoskeletal: Normal range of motion and neck supple.     Thyroid: No thyromegaly.     Vascular: No JVD.     Trachea: No tracheal deviation.  Cardiovascular:     Rate and Rhythm: Normal rate and regular rhythm.     Heart sounds: Normal heart sounds. No murmur. No friction rub. No gallop.   Pulmonary:     Effort:  Pulmonary effort is normal. No respiratory distress.     Breath sounds: No wheezing or rales.     Comments: Course breaths sounds Chest:     Chest wall: No tenderness.  Abdominal:     Palpations: Abdomen is soft.     Tenderness: There is no abdominal tenderness. There is no guarding.  Musculoskeletal: Normal range of motion.  Lymphadenopathy:     Cervical: No cervical adenopathy.  Skin:    General: Skin is warm and dry.  Neurological:     Mental Status: She is alert and oriented to person, place, and time.     Cranial Nerves: No cranial nerve deficit.  Psychiatric:        Behavior: Behavior normal.        Thought Content: Thought content normal.        Judgment: Judgment normal.    Assessment/Plan: 1. Upper respiratory tract infection, unspecified type Patient provided a course of Levaquin.  Patient should take allocations to completion.  Also informed to eat when taking this medication and to return to clinic if symptoms fail to improve after 7 to 10 days. - levofloxacin (LEVAQUIN) 500 MG tablet; Take 1 tablet (500 mg total) by mouth daily.  Dispense: 10 tablet; Refill: 0  2. Chronic obstructive pulmonary disease, unspecified COPD type (H[facility]) Stable, coarse breath sounds noted on exam today.  Patient has been using her inhalers as prescribed.  We will continue to monitor.  Instructed patient to have a low threshold return to clinic or going to emergency room for difficulty breathing.  3. Essential hypertension Patient's pressure elevated today.  Patient has been taking over-the-counter cough and decongestant medications with likely history of in her blood pressure up.  We will continue to monitor at future visits.  Noted patient to buy Coricidin brand medications that are safer for blood pressure.  4. Flu-like symptoms Flu a and B neg at this visit. - POCT Influenza A/B  General Counseling: Patricia Le verbalizes understanding of the findings of todays visit and agrees with plan of  treatment. I have discussed any further diagnostic evaluation that may be needed or ordered today. We also reviewed her medications today. she has been encouraged to call the office with any questions or concerns that should arise related to todays visit.   Orders Placed This Encounter  Procedures  . POCT Influenza A/B    Meds ordered this encounter  Medications  . levofloxacin (LEVAQUIN) 500 MG tablet    Sig: Take 1 tablet (500 mg total) by mouth daily.    Dispense:  10 tablet    Refill:  0    Time spent: 25 Minutes  This patient was seen by Orson Gear AGNP-C in Collaboration with Dr Lavera Guise as a  part of collaborative care agreement.  Kendell Bane AGNP-C Internal Medicine

## 2018-09-18 NOTE — Patient Instructions (Signed)

## 2018-09-19 ENCOUNTER — Other Ambulatory Visit: Payer: Self-pay

## 2018-09-19 MED ORDER — GABAPENTIN 300 MG PO CAPS: 300.0000 mg | ORAL_CAPSULE | Freq: Three times a day (TID) | ORAL | 0 refills | Status: DC

## 2018-09-28 ENCOUNTER — Other Ambulatory Visit: Payer: Self-pay

## 2018-09-28 MED ORDER — OMEPRAZOLE 40 MG PO CPDR: 40.0000 mg | DELAYED_RELEASE_CAPSULE | Freq: Every day | ORAL | 1 refills | Status: DC

## 2018-11-03 ENCOUNTER — Other Ambulatory Visit: Payer: Self-pay | Admitting: Nurse Practitioner

## 2018-11-04 LAB — COMPREHENSIVE METABOLIC PANEL
A/G RATIO: 2.4 — AB (ref 1.2–2.2)
ALBUMIN: 4.6 g/dL (ref 3.8–4.9)
ALK PHOS: 77 IU/L (ref 39–117)
ALT: 17 IU/L (ref 0–32)
AST: 17 IU/L (ref 0–40)
BILIRUBIN TOTAL: 0.5 mg/dL (ref 0.0–1.2)
BUN / CREAT RATIO: 11 — AB (ref 12–28)
BUN: 8 mg/dL (ref 8–27)
CHLORIDE: 104 mmol/L (ref 96–106)
CO2: 25 mmol/L (ref 20–29)
Calcium: 9.7 mg/dL (ref 8.7–10.3)
Creatinine, Ser: 0.76 mg/dL (ref 0.57–1.00)
GFR calc Af Amer: 99 mL/min/{1.73_m2} (ref >59)
GFR calc non Af Amer: 86 mL/min/{1.73_m2} (ref >59)
GLUCOSE: 116 mg/dL — AB (ref 65–99)
Globulin, Total: 1.9 g/dL (ref 1.5–4.5)
POTASSIUM: 4.1 mmol/L (ref 3.5–5.2)
Sodium: 144 mmol/L (ref 134–144)
Total Protein: 6.5 g/dL (ref 6.0–8.5)

## 2018-11-04 LAB — CBC
HEMATOCRIT: 39.8 % (ref 34.0–46.6)
Hemoglobin: 13.3 g/dL (ref 11.1–15.9)
MCH: 29.3 pg (ref 26.6–33.0)
MCHC: 33.4 g/dL (ref 31.5–35.7)
MCV: 88 fL (ref 79–97)
PLATELETS: 266 10*3/uL (ref 150–450)
RBC: 4.54 x10E6/uL (ref 3.77–5.28)
RDW: 13.1 % (ref 11.7–15.4)
WBC: 4.6 10*3/uL (ref 3.4–10.8)

## 2018-11-04 LAB — LIPID PANEL W/O CHOL/HDL RATIO
CHOLESTEROL TOTAL: 232 mg/dL — AB (ref 100–199)
HDL: 49 mg/dL (ref >39)
LDL Calculated: 145 mg/dL — ABNORMAL HIGH (ref 0–99)
TRIGLYCERIDES: 191 mg/dL — AB (ref 0–149)
VLDL Cholesterol Cal: 38 mg/dL (ref 5–40)

## 2018-11-04 LAB — VITAMIN D 25 HYDROXY (VIT D DEFICIENCY, FRACTURES): Vit D, 25-Hydroxy: 13.9 ng/mL — ABNORMAL LOW (ref 30.0–100.0)

## 2018-11-04 LAB — TSH: TSH: 1.44 u[IU]/mL (ref 0.450–4.500)

## 2018-11-04 LAB — T4, FREE: FREE T4: 1.2 ng/dL (ref 0.82–1.77)

## 2018-11-17 ENCOUNTER — Encounter: Payer: Self-pay | Admitting: Nurse Practitioner

## 2018-11-17 ENCOUNTER — Ambulatory Visit: Payer: Managed Care, Other (non HMO) | Admitting: Nurse Practitioner

## 2018-11-17 VITALS — BP 136/99 | HR 92 | Resp 16 | Ht 59.0 in | Wt 181.2 lb

## 2018-11-17 DIAGNOSIS — J449 Chronic obstructive pulmonary disease, unspecified: Secondary | ICD-10-CM

## 2018-11-17 DIAGNOSIS — E782 Mixed hyperlipidemia: Secondary | ICD-10-CM

## 2018-11-17 DIAGNOSIS — I10 Essential (primary) hypertension: Secondary | ICD-10-CM

## 2018-11-17 DIAGNOSIS — E559 Vitamin D deficiency, unspecified: Secondary | ICD-10-CM | POA: Diagnosis not present

## 2018-11-17 DIAGNOSIS — K449 Diaphragmatic hernia without obstruction or gangrene: Secondary | ICD-10-CM

## 2018-11-17 MED ORDER — ERGOCALCIFEROL 1.25 MG (50000 UT) PO CAPS: 50000.0000 [IU] | ORAL_CAPSULE | ORAL | 5 refills | Status: DC

## 2018-11-17 NOTE — Progress Notes (Signed)
[facility] [place], [place] [address]  Internal MEDICINE  Office Visit Note  Patient Name: Patricia Le  [id number]  [id number]  Date of Service: 11/17/2018  Chief Complaint  Patient presents with  . Medical Management of Chronic Issues    4 month follow up, pt want to talk about weight loss  . Hypertension  . Hyperlipidemia    The patient states her plantar fasciitis in left foot is getting better. Has been using "icy-hot" roll-on preparation to reduce pain which does help. Left hip is starting to hurt as well, aches into the buttock and lower back. She states that taking ibuprofen does help and helps her to rest well.  She has been seeing surgeon at [facility] related to large hiatal hernia. Has had endoscopy and manometer done. Will follow up with surgeon to discuss repair. This is likely contributing to shortness of breath and fatigue .      Current Medication: Outpatient Encounter Medications as of 11/17/2018  Medication Sig  . budesonide-formoterol (SYMBICORT) 80-4.5 MCG/ACT inhaler Inhale 2 puffs into the lungs daily as needed.  . cetirizine (ZYRTEC) 10 MG tablet TAKE 1 BY MOUTH DAILY AT BEDTIME  . fluticasone (FLONASE) 50 MCG/ACT nasal spray 2 SPRAYS IN BOTH NOSTRILS EVERY DAY FOR RHINITIS  . gabapentin (NEURONTIN) 300 MG capsule Take 1 capsule (300 mg total) by mouth 3 (three) times daily.  Marland Kitchen letrozole (FEMARA) 2.5 MG tablet Take 1 tablet by mouth  daily  . meloxicam (MOBIC) 7.5 MG tablet Take 1 tablet (7.5 mg total) by mouth as needed for pain.  . metoprolol tartrate (LOPRESSOR) 25 MG tablet TAKE 1 TABLET BY MOUTH  TWICE A DAY  . omeprazole (PRILOSEC) 40 MG capsule Take 1 capsule (40 mg total) by mouth daily.  Marland Kitchen triamcinolone cream (KENALOG) 0.1 % Apply to all affected areas twice daily as needed  . [DISCONTINUED] levofloxacin (LEVAQUIN) 500 MG tablet Take 1 tablet (500 mg total) by mouth daily.  . ergocalciferol (DRISDOL) 1.25 MG (50000 UT) capsule  Take 1 capsule (50,000 Units total) by mouth once a week.   No facility-administered encounter medications on file as of 11/17/2018.     Surgical History: Past Surgical History:  Procedure Laterality Date  . ABDOMINAL HYSTERECTOMY    . BACK SURGERY    . BREAST BIOPSY Right 2015   DCIS  . BREAST LUMPECTOMY Right 2015   DCIS with Mammosite  . BREAST MAMMOSITE Right 07-02-14  . BREAST SURGERY Right 06-06-14   Wide excision of Right breast DCIS  . CESAREAN SECTION    . CHOLECYSTECTOMY    . COLONOSCOPY     over 10 yrs ago/ Dr Kristopher Glee  . COLONOSCOPY  11-20-14   Dr Jamal Collin hyperplastic polyp  . TONSILLECTOMY    . TUBAL LIGATION      Medical History: Past Medical History:  Diagnosis Date  . Anemia   . 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 Cancer [facility]    DCIS right breast  . Colon polyp   . GERD (gastroesophageal reflux disease)   . Hiatal hernia   . Hyperlipidemia   . Hypertension   . Restless leg syndrome     Family History: Family History  Problem Relation Age of Onset  . Diabetes Mother   . Cancer Father 68       melanoma  . Breast cancer Neg Hx     Social History  Socioeconomic History  . Marital status: Divorced    Spouse name: Not on file  . Number of children: Not on file  . Years of education: Not on file  . Highest education level: Not on file  Occupational History  . Not on file  Social Needs  . Financial resource strain: Not on file  . Food insecurity:    Worry: Not on file    Inability: Not on file  . Transportation needs:    Medical: Not on file    Non-medical: Not on file  Tobacco Use  . Smoking status: Former Smoker    Packs/day: 1.00    Years: 20.00    Pack years: 20.00    Types: Cigarettes  . Smokeless tobacco: Never Used  Substance and Sexual Activity  . Alcohol use: Yes    Alcohol/week: 0.0 standard drinks    Comment: occasionally  . Drug use: Never  . Sexual activity: Not on file   Lifestyle  . Physical activity:    Days per week: Not on file    Minutes per session: Not on file  . Stress: Not on file  Relationships  . Social connections:    Talks on phone: Not on file    Gets together: Not on file    Attends religious service: Not on file    Active member of club or organization: Not on file    Attends meetings of clubs or organizations: Not on file    Relationship status: Not on file  . Intimate partner violence:    Fear of current or ex partner: Not on file    Emotionally abused: Not on file    Physically abused: Not on file    Forced sexual activity: Not on file  Other Topics Concern  . Not on file  Social History Narrative  . Not on file      Review of Systems  Constitutional: Positive for fatigue. Negative for activity change, chills and unexpected weight change.  HENT: Negative for congestion, postnasal drip, rhinorrhea, sneezing and sore throat.   Respiratory: Positive for shortness of breath and wheezing. Negative for cough and chest tightness.        Intermittent wheezing which is relieved by using her rescue inhaler   Cardiovascular: Negative for chest pain and palpitations.       Elevated blood pressure today.  Gastrointestinal: Negative for abdominal pain, constipation, diarrhea, nausea and vomiting.  Endocrine: Positive for heat intolerance. Negative for cold intolerance, polydipsia and polyuria.  Musculoskeletal: Positive for arthralgias and back pain. Negative for joint swelling and neck pain.       Lower back pain, radiating into the left buttock and hip. Left foot pain is improving .  Skin: Negative for rash.  Allergic/Immunologic: Positive for environmental allergies.  Neurological: Negative for dizziness, tremors, numbness and headaches.  Hematological: Negative for adenopathy. Does not bruise/bleed easily.  Psychiatric/Behavioral: Negative for behavioral problems (Depression), sleep disturbance and suicidal ideas. The patient is  not nervous/anxious.     Today's Vitals   11/17/18 1359  BP: (!) 136/99  Pulse: 92  Resp: 16  SpO2: 97%  Weight: 181 lb 3.2 oz (82.2 kg)  Height: 4\' 11"  (1.499 m)   Body mass index is 36.6 kg/m.  Physical Exam Vitals signs and nursing note reviewed.  Constitutional:      General: She is not in acute distress.    Appearance: Normal appearance. She is well-developed. She is not diaphoretic.  HENT:  Head: Normocephalic and atraumatic.     Mouth/Throat:     Pharynx: No oropharyngeal exudate.  Eyes:     Conjunctiva/sclera: Conjunctivae normal.     Pupils: Pupils are equal, round, and reactive to light.  Neck:     Musculoskeletal: Normal range of motion and neck supple.     Thyroid: No thyromegaly.     Vascular: No JVD.     Trachea: No tracheal deviation.  Cardiovascular:     Rate and Rhythm: Normal rate and regular rhythm.     Heart sounds: Normal heart sounds. No murmur. No friction rub. No gallop.   Pulmonary:     Effort: Pulmonary effort is normal. No respiratory distress.     Breath sounds: Normal breath sounds. No wheezing or rales.  Chest:     Chest wall: No tenderness.  Abdominal:     General: Bowel sounds are normal.     Palpations: Abdomen is soft.     Tenderness: There is no abdominal tenderness.  Musculoskeletal: Normal range of motion.  Lymphadenopathy:     Cervical: No cervical adenopathy.  Skin:    General: Skin is warm and dry.     Capillary Refill: Capillary refill takes less than 2 seconds.  Neurological:     Mental Status: She is alert and oriented to person, place, and time.     Cranial Nerves: No cranial nerve deficit.  Psychiatric:        Behavior: Behavior normal.        Thought Content: Thought content normal.        Judgment: Judgment normal.   Assessment/Plan: 1. Essential hypertension Generally stable. Continue bp medication as prescribed   2. Mixed hyperlipidemia Mild elevation of LD:, triglycerides, and total cholesterol.  Discussed dietary and lifestyle changes to improve cholesterol.   3. Chronic obstructive pulmonary disease, unspecified COPD type ([place]) Continue to use inhalers as prescribed   4. Vitamin D deficiency Drisdol once weekly.  - ergocalciferol (DRISDOL) 1.25 MG (50000 UT) capsule; Take 1 capsule (50,000 Units total) by mouth once a week.  Dispense: 4 capsule; Refill: 5  5. Large hiatal hernia Follow up with surgeon for continued evaluation and treatment.   General Counseling: Raechel verbalizes understanding of the findings of todays visit and agrees with plan of treatment. I have discussed any further diagnostic evaluation that may be needed or ordered today. We also reviewed her medications today. she has been encouraged to call the office with any questions or concerns that should arise related to todays visit.  Hypertension Counseling:   The following hypertensive lifestyle modification were recommended and discussed:  1. Limiting alcohol intake to less than 1 oz/day of ethanol:(24 oz of beer or 8 oz of wine or 2 oz of 100-proof whiskey). 2. Take baby ASA 81 mg daily. 3. Importance of regular aerobic exercise and losing weight. 4. Reduce dietary saturated fat and cholesterol intake for overall cardiovascular health. 5. Maintaining adequate dietary potassium, calcium, and magnesium intake. 6. Regular monitoring of the blood pressure. 7. Reduce sodium intake to less than 100 mmol/day (less than 2.3 gm of sodium or less than 6 gm of sodium choride)   This patient was seen by [place] with Dr Lavera Guise as a part of collaborative care agreement  Meds ordered this encounter  Medications  . ergocalciferol (DRISDOL) 1.25 MG (50000 UT) capsule    Sig: Take 1 capsule (50,000 Units total) by mouth once a week.    Dispense:  4 capsule    Refill:  5    Order Specific Question:   Supervising Provider    Answer:   Lavera Guise [[id number]]    Time spent: 44  Minutes      Dr Lavera Guise Internal medicine

## 2018-11-17 NOTE — Progress Notes (Signed)
Pt blood pressure elevated taken twice  First reading 143/104 Second reading 136/99

## 2018-11-27 ENCOUNTER — Other Ambulatory Visit: Payer: Self-pay

## 2018-11-27 MED ORDER — METOPROLOL TARTRATE 25 MG PO TABS: 25.0000 mg | ORAL_TABLET | Freq: Two times a day (BID) | ORAL | 2 refills | Status: DC

## 2018-12-13 ENCOUNTER — Other Ambulatory Visit: Payer: Self-pay

## 2018-12-13 MED ORDER — GABAPENTIN 300 MG PO CAPS: 300.0000 mg | ORAL_CAPSULE | Freq: Three times a day (TID) | ORAL | 0 refills | Status: DC

## 2019-02-16 ENCOUNTER — Other Ambulatory Visit: Payer: Self-pay

## 2019-02-16 MED ORDER — OMEPRAZOLE 40 MG PO CPDR: 40.0000 mg | DELAYED_RELEASE_CAPSULE | Freq: Every day | ORAL | 1 refills | Status: DC

## 2019-02-28 ENCOUNTER — Encounter: Payer: Self-pay | Admitting: *Deleted

## 2019-03-07 ENCOUNTER — Other Ambulatory Visit: Payer: Self-pay

## 2019-03-08 ENCOUNTER — Encounter: Payer: Self-pay | Admitting: Radiation Oncology

## 2019-03-08 ENCOUNTER — Ambulatory Visit
Admission: RE | Admit: 2019-03-08 | Discharge: 2019-03-08 | Disposition: A | Discharge status: Home / Self Care | Payer: Managed Care, Other (non HMO) | Source: Ambulatory Visit | Attending: Radiation Oncology | Admitting: Radiation Oncology

## 2019-03-08 ENCOUNTER — Other Ambulatory Visit: Payer: Self-pay

## 2019-03-08 DIAGNOSIS — Z79811 Long term (current) use of aromatase inhibitors: Secondary | ICD-10-CM | POA: Diagnosis not present

## 2019-03-08 DIAGNOSIS — Z923 Personal history of irradiation: Secondary | ICD-10-CM | POA: Insufficient documentation

## 2019-03-08 DIAGNOSIS — D0511 Intraductal carcinoma in situ of right breast: Secondary | ICD-10-CM | POA: Insufficient documentation

## 2019-03-08 DIAGNOSIS — Z170 Estrogen receptor positive status [ER+]: Secondary | ICD-10-CM | POA: Diagnosis not present

## 2019-03-08 NOTE — Progress Notes (Signed)
Radiation Oncology Follow up Note  Name: Patricia Le   Date:   03/08/2019 MRN:  [id number] DOB: 1956/06/17    This 63 y.o. 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  Tumor was ER PR positive.  She is seen today in routine follow-up is doing well specifically denies breast tenderness cough or bone pain..  She is currently on Femara tolerating that well without side effect.  She had mammograms back in September which I have reviewed were BI-RADS 2 benign.  COMPLICATIONS OF TREATMENT: none  FOLLOW UP COMPLIANCE: keeps appointments   PHYSICAL EXAM:  There were no vitals taken for this visit. Lungs are clear to A&P cardiac examination essentially unremarkable with regular rate and rhythm. No dominant mass or nodularity is noted in either breast in 2 positions examined. Incision is well-healed. No axillary or supraclavicular adenopathy is appreciated. Cosmetic result is excellent.  Well-developed well-nourished patient in NAD. HEENT reveals PERLA, EOMI, discs not visualized.  Oral cavity is clear. No oral mucosal lesions are identified. Neck is clear without evidence of cervical or supraclavicular adenopathy. Lungs are clear to A&P. Cardiac examination is essentially unremarkable with regular rate and rhythm without murmur rub or thrill. Abdomen is benign with no organomegaly or masses noted. Motor sensory and DTR levels are equal and symmetric in the upper and lower extremities. Cranial nerves II through XII are grossly intact. Proprioception is intact. No peripheral adenopathy or edema is identified. No motor or sensory levels are noted. Crude visual fields are within  normal range.  RADIOLOGY RESULTS: Mammograms reviewed compatible with above-stated findings  PLAN: Present time she is now 4 and half years out with no evidence of disease.  I am pleased with her overall progress.  I am going to discontinue follow-up care.  Patient knows to call at anytime with any concerns.  I would like to take this opportunity to thank you for allowing me to participate in the care of your patient.Noreene Filbert, MD

## 2019-03-09 ENCOUNTER — Other Ambulatory Visit: Payer: Self-pay

## 2019-03-09 MED ORDER — GABAPENTIN 300 MG PO CAPS: 300.0000 mg | ORAL_CAPSULE | Freq: Three times a day (TID) | ORAL | 0 refills | Status: DC

## 2019-03-19 ENCOUNTER — Other Ambulatory Visit: Payer: Self-pay

## 2019-03-19 ENCOUNTER — Ambulatory Visit: Payer: Managed Care, Other (non HMO) | Admitting: Nurse Practitioner

## 2019-03-19 ENCOUNTER — Encounter: Payer: Self-pay | Admitting: Nurse Practitioner

## 2019-03-19 VITALS — BP 140/101 | HR 107 | Resp 16 | Ht 59.0 in | Wt 187.2 lb

## 2019-03-19 DIAGNOSIS — J449 Chronic obstructive pulmonary disease, unspecified: Secondary | ICD-10-CM

## 2019-03-19 DIAGNOSIS — I10 Essential (primary) hypertension: Secondary | ICD-10-CM

## 2019-03-19 DIAGNOSIS — D0511 Intraductal carcinoma in situ of right breast: Secondary | ICD-10-CM | POA: Diagnosis not present

## 2019-03-19 DIAGNOSIS — E559 Vitamin D deficiency, unspecified: Secondary | ICD-10-CM

## 2019-03-19 MED ORDER — ERGOCALCIFEROL 1.25 MG (50000 UT) PO CAPS: 50000.0000 [IU] | ORAL_CAPSULE | ORAL | 1 refills | Status: DC

## 2019-03-19 MED ORDER — METOPROLOL TARTRATE 50 MG PO TABS: 50.0000 mg | ORAL_TABLET | Freq: Two times a day (BID) | ORAL | 1 refills | Status: DC

## 2019-03-19 MED ORDER — ALBUTEROL SULFATE HFA 108 (90 BASE) MCG/ACT IN AERS: 2.0000 | INHALATION_SPRAY | Freq: Four times a day (QID) | RESPIRATORY_TRACT | 3 refills | Status: DC | PRN

## 2019-03-19 NOTE — Progress Notes (Signed)
[facility] [place], [place] [address]  Internal MEDICINE  Office Visit Note  Patient Name: Patricia Le  [id number]  [id number]  Date of Service: 03/19/2019  Chief Complaint  Patient presents with  . Hypertension    pt has had increase in bp  . Hyperlipidemia  . Gastroesophageal Reflux  . Vitamin D    insurance did not cover rx. said insurance would cover it through mail order?  . Medication Management    inhalers    The patient is here for routine follow up. Blood pressure is elevated. Has been climbing steadily since her last visit. She did have eye exam about three weeks ago and was shown to have elevated blood pressure. She got new cuff then and states that blood pressure continues to be elevated. She states that she is working from home, and she is snacking a lot. Eats a lot of salty foods. Is trying to cut back on the salt intake. Will increase water intake.  Breathing has been doing ok. Currently on symbicort twice daily and spriva daily. She does not have use or have prescription for rescue inhaler at this time. She had last appointment with radiation oncologist. Has been released. Has two more months on letrozole.       Current Medication: Outpatient Encounter Medications as of 03/19/2019  Medication Sig  . budesonide-formoterol (SYMBICORT) 80-4.5 MCG/ACT inhaler Inhale 2 puffs into the lungs daily as needed.  . cetirizine (ZYRTEC) 10 MG tablet TAKE 1 BY MOUTH DAILY AT BEDTIME  . ergocalciferol (DRISDOL) 1.25 MG (50000 UT) capsule Take 1 capsule (50,000 Units total) by mouth once a week.  . fluticasone (FLONASE) 50 MCG/ACT nasal spray 2 SPRAYS IN BOTH NOSTRILS EVERY DAY FOR RHINITIS  . gabapentin (NEURONTIN) 300 MG capsule Take 1 capsule (300 mg total) by mouth 3 (three) times daily.  Marland Kitchen letrozole (FEMARA) 2.5 MG tablet Take 1 tablet by mouth  daily  . meloxicam (MOBIC) 7.5 MG tablet Take 1 tablet (7.5 mg total) by mouth as needed for pain.  .  metoprolol tartrate (LOPRESSOR) 50 MG tablet Take 1 tablet (50 mg total) by mouth 2 (two) times daily.  Marland Kitchen omeprazole (PRILOSEC) 40 MG capsule Take 1 capsule (40 mg total) by mouth daily.  Marland Kitchen triamcinolone cream (KENALOG) 0.1 % Apply to all affected areas twice daily as needed  . [DISCONTINUED] ergocalciferol (DRISDOL) 1.25 MG (50000 UT) capsule Take 1 capsule (50,000 Units total) by mouth once a week.  . [DISCONTINUED] metoprolol tartrate (LOPRESSOR) 25 MG tablet Take 1 tablet (25 mg total) by mouth 2 (two) times daily.  Marland Kitchen albuterol (VENTOLIN HFA) 108 (90 Base) MCG/ACT inhaler Inhale 2 puffs into the lungs 4 (four) times daily as needed for wheezing or shortness of breath.   No facility-administered encounter medications on file as of 03/19/2019.     Surgical History: Past Surgical History:  Procedure Laterality Date  . ABDOMINAL HYSTERECTOMY    . BACK SURGERY    . BREAST BIOPSY Right 2015   DCIS  . BREAST LUMPECTOMY Right 2015   DCIS with Mammosite  . BREAST MAMMOSITE Right 07-02-14  . BREAST SURGERY Right 06-06-14   Wide excision of Right breast DCIS  . CESAREAN SECTION    . CHOLECYSTECTOMY    . COLONOSCOPY     over 10 yrs ago/ Dr Kristopher Glee  . COLONOSCOPY  11-20-14   Dr Jamal Collin hyperplastic polyp  . TONSILLECTOMY    . TUBAL LIGATION  Medical History: Past Medical History:  Diagnosis Date  . Anemia   . 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 Cancer [facility])    DCIS right breast  . Colon polyp   . GERD (gastroesophageal reflux disease)   . Hiatal hernia   . Hyperlipidemia   . Hypertension   . Restless leg syndrome     Family History: Family History  Problem Relation Age of Onset  . Diabetes Mother   . Cancer Father 62       melanoma  . Breast cancer Neg Hx     Social History   Socioeconomic History  . Marital status: Divorced    Spouse name: Not on file  . Number of children: Not on file  . Years of education: Not on file   . Highest education level: Not on file  Occupational History  . Not on file  Social Needs  . Financial resource strain: Not on file  . Food insecurity    Worry: Not on file    Inability: Not on file  . Transportation needs    Medical: Not on file    Non-medical: Not on file  Tobacco Use  . Smoking status: Former Smoker    Packs/day: 1.00    Years: 20.00    Pack years: 20.00    Types: Cigarettes  . Smokeless tobacco: Never Used  Substance and Sexual Activity  . Alcohol use: Yes    Alcohol/week: 0.0 standard drinks    Comment: occasionally  . Drug use: Never  . Sexual activity: Not on file  Lifestyle  . Physical activity    Days per week: Not on file    Minutes per session: Not on file  . Stress: Not on file  Relationships  . Social Herbalist on phone: Not on file    Gets together: Not on file    Attends religious service: Not on file    Active member of club or organization: Not on file    Attends meetings of clubs or organizations: Not on file    Relationship status: Not on file  . Intimate partner violence    Fear of current or ex partner: Not on file    Emotionally abused: Not on file    Physically abused: Not on file    Forced sexual activity: Not on file  Other Topics Concern  . Not on file  Social History Narrative  . Not on file      Review of Systems  Constitutional: Negative for activity change, chills, fatigue and unexpected weight change.  HENT: Negative for congestion, postnasal drip, rhinorrhea, sneezing and sore throat.   Respiratory: Positive for shortness of breath and wheezing. Negative for cough and chest tightness.        Intermittent wheezing especially when having to wear mask in public.   Cardiovascular: Negative for chest pain and palpitations.       Elevated blood pressure and heart rate today.  Gastrointestinal: Negative for abdominal pain, constipation, diarrhea, nausea and vomiting.  Endocrine: Positive for heat  intolerance. Negative for cold intolerance, polydipsia and polyuria.  Musculoskeletal: Negative for arthralgias, back pain, joint swelling and neck pain.  Skin: Negative for rash.  Allergic/Immunologic: Positive for environmental allergies.  Neurological: Negative for dizziness, tremors, numbness and headaches.  Hematological: Negative for adenopathy. Does not bruise/bleed easily.  Psychiatric/Behavioral: Negative for behavioral problems (Depression), sleep disturbance and suicidal ideas. The patient  is not nervous/anxious.     Today's Vitals   03/19/19 1440  BP: (!) 140/101  Pulse: (!) 107  Resp: 16  SpO2: 98%  Weight: 187 lb 3.2 oz (84.9 kg)  Height: 4\' 11"  (1.499 m)   Body mass index is 37.81 kg/m.  Physical Exam Vitals signs and nursing note reviewed.  Constitutional:      General: She is not in acute distress.    Appearance: Normal appearance. She is well-developed. She is not diaphoretic.  HENT:     Head: Normocephalic and atraumatic.     Mouth/Throat:     Pharynx: No oropharyngeal exudate.  Eyes:     Conjunctiva/sclera: Conjunctivae normal.     Pupils: Pupils are equal, round, and reactive to light.  Neck:     Musculoskeletal: Normal range of motion and neck supple.     Thyroid: No thyromegaly.     Vascular: No JVD.     Trachea: No tracheal deviation.  Cardiovascular:     Rate and Rhythm: Regular rhythm. Tachycardia present.     Heart sounds: Normal heart sounds. No murmur. No friction rub. No gallop.   Pulmonary:     Effort: Pulmonary effort is normal. No respiratory distress.     Breath sounds: Normal breath sounds. No wheezing or rales.  Chest:     Chest wall: No tenderness.  Abdominal:     Palpations: Abdomen is soft.     Tenderness: There is no abdominal tenderness.  Musculoskeletal: Normal range of motion.  Lymphadenopathy:     Cervical: No cervical adenopathy.  Skin:    General: Skin is warm and dry.     Capillary Refill: Capillary refill takes less  than 2 seconds.  Neurological:     Mental Status: She is alert and oriented to person, place, and time.     Cranial Nerves: No cranial nerve deficit.  Psychiatric:        Behavior: Behavior normal.        Thought Content: Thought content normal.        Judgment: Judgment normal.    Assessment/Plan: 1. Essential hypertension Increase metoprolol to 50mg  twice daily. Advised cutting back on salt and increasing daily water intake.  - metoprolol tartrate (LOPRESSOR) 50 MG tablet; Take 1 tablet (50 mg total) by mouth 2 (two) times daily.  Dispense: 180 tablet; Refill: 1  2. Chronic obstructive pulmonary disease, unspecified COPD type ([place]) Add albuterol rescue inhaler. May use 2 puffs four times daily if needed for wheezing. cotniue symbicort and spiriva as prescribed.  - albuterol (VENTOLIN HFA) 108 (90 Base) MCG/ACT inhaler; Inhale 2 puffs into the lungs 4 (four) times daily as needed for wheezing or shortness of breath.  Dispense: 15 g; Refill: 3  3. Vitamin D deficiency Ninety day prescription of drisdol sent to mail in pharmacy.  - ergocalciferol (DRISDOL) 1.25 MG (50000 UT) capsule; Take 1 capsule (50,000 Units total) by mouth once a week.  Dispense: 12 capsule; Refill: 1  4. Ductal carcinoma in situ (DCIS) of right breast Follow up with oncology/surgery as scheduled. .  General Counseling: Patricia Le verbalizes understanding of the findings of todays visit and agrees with plan of treatment. I have discussed any further diagnostic evaluation that may be needed or ordered today. We also reviewed her medications today. she has been encouraged to call the office with any questions or concerns that should arise related to todays visit.  Hypertension Counseling:   The following hypertensive lifestyle modification were recommended and discussed:  1. Limiting alcohol intake to less than 1 oz/day of ethanol:(24 oz of beer or 8 oz of wine or 2 oz of 100-proof whiskey). 2. Take baby ASA 81 mg  daily. 3. Importance of regular aerobic exercise and losing weight. 4. Reduce dietary saturated fat and cholesterol intake for overall cardiovascular health. 5. Maintaining adequate dietary potassium, calcium, and magnesium intake. 6. Regular monitoring of the blood pressure. 7. Reduce sodium intake to less than 100 mmol/day (less than 2.3 gm of sodium or less than 6 gm of sodium choride)   This patient was seen by [place] with Dr Lavera Guise as a part of collaborative care agreement  Meds ordered this encounter  Medications  . metoprolol tartrate (LOPRESSOR) 50 MG tablet    Sig: Take 1 tablet (50 mg total) by mouth 2 (two) times daily.    Dispense:  180 tablet    Refill:  1    Please note increase in dosing.    Order Specific Question:   Supervising Provider    Answer:   Lavera Guise [[id number]]  . ergocalciferol (DRISDOL) 1.25 MG (50000 UT) capsule    Sig: Take 1 capsule (50,000 Units total) by mouth once a week.    Dispense:  12 capsule    Refill:  1    Order Specific Question:   Supervising Provider    Answer:   Lavera Guise [[id number]]  . albuterol (VENTOLIN HFA) 108 (90 Base) MCG/ACT inhaler    Sig: Inhale 2 puffs into the lungs 4 (four) times daily as needed for wheezing or shortness of breath.    Dispense:  15 g    Refill:  3    Order Specific Question:   Supervising Provider    Answer:   Lavera Guise [[id number]]    Time spent: 67 Minutes      Dr Lavera Guise Internal medicine

## 2019-04-10 ENCOUNTER — Encounter: Payer: Self-pay | Admitting: General Surgery

## 2019-04-12 ENCOUNTER — Other Ambulatory Visit: Payer: Self-pay

## 2019-04-12 ENCOUNTER — Encounter: Payer: Self-pay | Admitting: Nurse Practitioner

## 2019-04-12 ENCOUNTER — Ambulatory Visit: Payer: Managed Care, Other (non HMO) | Admitting: Nurse Practitioner

## 2019-04-12 VITALS — BP 130/94 | HR 83 | Resp 16 | Ht 59.0 in | Wt 184.0 lb

## 2019-04-12 DIAGNOSIS — I10 Essential (primary) hypertension: Secondary | ICD-10-CM

## 2019-04-12 DIAGNOSIS — J449 Chronic obstructive pulmonary disease, unspecified: Secondary | ICD-10-CM

## 2019-04-12 MED ORDER — BUDESONIDE-FORMOTEROL FUMARATE 80-4.5 MCG/ACT IN AERO: 2.0000 | INHALATION_SPRAY | Freq: Every day | RESPIRATORY_TRACT | 3 refills | Status: DC | PRN

## 2019-04-12 MED ORDER — SPIRIVA RESPIMAT 2.5 MCG/ACT IN AERS: 1.0000 | INHALATION_SPRAY | Freq: Every day | RESPIRATORY_TRACT | 3 refills | Status: DC

## 2019-04-12 NOTE — Progress Notes (Signed)
[facility] [place], [place] [address]  Internal MEDICINE  Office Visit Note  Patient Name: Patricia Le  [id number]  [id number]  Date of Service: 04/28/2019  Chief Complaint  Patient presents with  . Medical Management of Chronic Issues    3wk follow up increased blood pressure medication  . Hypertension    The patient is here for follow up of blood pressure. Increased metoprolol to 50mg  twice daily. Blood pressure and heart rate have both improved. She has not negative side effects associated with taking higher dose of medication.       Current Medication: Outpatient Encounter Medications as of 04/12/2019  Medication Sig  . albuterol (VENTOLIN HFA) 108 (90 Base) MCG/ACT inhaler Inhale 2 puffs into the lungs 4 (four) times daily as needed for wheezing or shortness of breath.  . budesonide-formoterol (SYMBICORT) 80-4.5 MCG/ACT inhaler Inhale 2 puffs into the lungs daily as needed.  . cetirizine (ZYRTEC) 10 MG tablet TAKE 1 BY MOUTH DAILY AT BEDTIME  . ergocalciferol (DRISDOL) 1.25 MG (50000 UT) capsule Take 1 capsule (50,000 Units total) by mouth once a week.  . fluticasone (FLONASE) 50 MCG/ACT nasal spray 2 SPRAYS IN BOTH NOSTRILS EVERY DAY FOR RHINITIS  . gabapentin (NEURONTIN) 300 MG capsule Take 1 capsule (300 mg total) by mouth 3 (three) times daily.  Marland Kitchen letrozole (FEMARA) 2.5 MG tablet Take 1 tablet by mouth  daily  . meloxicam (MOBIC) 7.5 MG tablet Take 1 tablet (7.5 mg total) by mouth as needed for pain.  . metoprolol tartrate (LOPRESSOR) 50 MG tablet Take 1 tablet (50 mg total) by mouth 2 (two) times daily.  Marland Kitchen omeprazole (PRILOSEC) 40 MG capsule Take 1 capsule (40 mg total) by mouth daily.  Marland Kitchen triamcinolone cream (KENALOG) 0.1 % Apply to all affected areas twice daily as needed  . [DISCONTINUED] budesonide-formoterol (SYMBICORT) 80-4.5 MCG/ACT inhaler Inhale 2 puffs into the lungs daily as needed.  . [DISCONTINUED] Tiotropium Bromide Monohydrate  (SPIRIVA RESPIMAT) 2.5 MCG/ACT AERS Inhale 1 puff into the lungs daily at 6 (six) AM.   No facility-administered encounter medications on file as of 04/12/2019.     Surgical History: Past Surgical History:  Procedure Laterality Date  . ABDOMINAL HYSTERECTOMY    . BACK SURGERY    . BREAST BIOPSY Right 2015   DCIS  . BREAST LUMPECTOMY Right 2015   DCIS with Mammosite  . BREAST MAMMOSITE Right 07-02-14  . BREAST SURGERY Right 06-06-14   Wide excision of Right breast DCIS  . CESAREAN SECTION    . CHOLECYSTECTOMY    . COLONOSCOPY     over 10 yrs ago/ Dr Kristopher Glee  . COLONOSCOPY  11-20-14   Dr Jamal Collin hyperplastic polyp  . TONSILLECTOMY    . TUBAL LIGATION      Medical History: Past Medical History:  Diagnosis Date  . Anemia   . 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 Cancer [facility])    DCIS right breast  . Colon polyp   . GERD (gastroesophageal reflux disease)   . Hiatal hernia   . Hyperlipidemia   . Hypertension   . Restless leg syndrome     Family History: Family History  Problem Relation Age of Onset  . Diabetes Mother   . Cancer Father 46       melanoma  . Breast cancer Neg Hx     Social History   Socioeconomic History  . Marital status:  Divorced    Spouse name: Not on file  . Number of children: Not on file  . Years of education: Not on file  . Highest education level: Not on file  Occupational History  . Not on file  Social Needs  . Financial resource strain: Not on file  . Food insecurity    Worry: Not on file    Inability: Not on file  . Transportation needs    Medical: Not on file    Non-medical: Not on file  Tobacco Use  . Smoking status: Former Smoker    Packs/day: 1.00    Years: 20.00    Pack years: 20.00    Types: Cigarettes  . Smokeless tobacco: Never Used  Substance and Sexual Activity  . Alcohol use: Yes    Alcohol/week: 0.0 standard drinks    Comment: occasionally  . Drug use: Never  . Sexual  activity: Not on file  Lifestyle  . Physical activity    Days per week: Not on file    Minutes per session: Not on file  . Stress: Not on file  Relationships  . Social Herbalist on phone: Not on file    Gets together: Not on file    Attends religious service: Not on file    Active member of club or organization: Not on file    Attends meetings of clubs or organizations: Not on file    Relationship status: Not on file  . Intimate partner violence    Fear of current or ex partner: Not on file    Emotionally abused: Not on file    Physically abused: Not on file    Forced sexual activity: Not on file  Other Topics Concern  . Not on file  Social History Narrative  . Not on file      Review of Systems  Constitutional: Negative for activity change, chills, fatigue and unexpected weight change.  HENT: Negative for congestion, postnasal drip, rhinorrhea, sneezing and sore throat.   Respiratory: Positive for shortness of breath and wheezing. Negative for cough and chest tightness.        Intermittent wheezing especially when having to wear mask in public.   Cardiovascular: Negative for chest pain and palpitations.       Improved heart rate and blood pressure today.   Gastrointestinal: Negative for abdominal pain, constipation, diarrhea, nausea and vomiting.  Endocrine: Positive for heat intolerance. Negative for cold intolerance, polydipsia and polyuria.  Musculoskeletal: Negative for arthralgias, back pain, joint swelling and neck pain.  Skin: Negative for rash.  Allergic/Immunologic: Positive for environmental allergies.  Neurological: Negative for dizziness, tremors, numbness and headaches.  Hematological: Negative for adenopathy. Does not bruise/bleed easily.  Psychiatric/Behavioral: Negative for behavioral problems (Depression), sleep disturbance and suicidal ideas. The patient is not nervous/anxious.     Today's Vitals   04/12/19 1451 04/12/19 1503  BP: (!) 126/97  (!) 130/94  Pulse: 83   Resp: 16   SpO2: 94%   Weight: 184 lb (83.5 kg)   Height: 4\' 11"  (1.499 m)    Body mass index is 37.16 kg/m.  Physical Exam Vitals signs and nursing note reviewed.  Constitutional:      General: She is not in acute distress.    Appearance: Normal appearance. She is well-developed. She is not diaphoretic.  HENT:     Head: Normocephalic and atraumatic.     Mouth/Throat:     Pharynx: No oropharyngeal exudate.  Eyes:  Conjunctiva/sclera: Conjunctivae normal.     Pupils: Pupils are equal, round, and reactive to light.  Neck:     Musculoskeletal: Normal range of motion and neck supple.     Thyroid: No thyromegaly.     Vascular: No JVD.     Trachea: No tracheal deviation.  Cardiovascular:     Rate and Rhythm: Regular rhythm. Tachycardia present.     Heart sounds: Normal heart sounds. No murmur. No friction rub. No gallop.   Pulmonary:     Effort: Pulmonary effort is normal. No respiratory distress.     Breath sounds: Normal breath sounds. No wheezing or rales.  Chest:     Chest wall: No tenderness.  Abdominal:     Palpations: Abdomen is soft.     Tenderness: There is no abdominal tenderness.  Musculoskeletal: Normal range of motion.  Lymphadenopathy:     Cervical: No cervical adenopathy.  Skin:    General: Skin is warm and dry.     Capillary Refill: Capillary refill takes less than 2 seconds.  Neurological:     Mental Status: She is alert and oriented to person, place, and time.     Cranial Nerves: No cranial nerve deficit.  Psychiatric:        Behavior: Behavior normal.        Thought Content: Thought content normal.        Judgment: Judgment normal.    Assessment/Plan: 1. Essential hypertension Stable. Continue metoprolol 50mg  twice daily.   2. Chronic obstructive pulmonary disease, unspecified COPD type ([place]) Continue symbicort ad spiriva as prescribed. Use rescue inhaler as needed and as prescribed . - budesonide-formoterol  (SYMBICORT) 80-4.5 MCG/ACT inhaler; Inhale 2 puffs into the lungs daily as needed.  Dispense: 3 Inhaler; Refill: 3  General Counseling: Blonnie verbalizes understanding of the findings of todays visit and agrees with plan of treatment. I have discussed any further diagnostic evaluation that may be needed or ordered today. We also reviewed her medications today. she has been encouraged to call the office with any questions or concerns that should arise related to todays visit.   Hypertension Counseling:   The following hypertensive lifestyle modification were recommended and discussed:  1. Limiting alcohol intake to less than 1 oz/day of ethanol:(24 oz of beer or 8 oz of wine or 2 oz of 100-proof whiskey). 2. Take baby ASA 81 mg daily. 3. Importance of regular aerobic exercise and losing weight. 4. Reduce dietary saturated fat and cholesterol intake for overall cardiovascular health. 5. Maintaining adequate dietary potassium, calcium, and magnesium intake. 6. Regular monitoring of the blood pressure. 7. Reduce sodium intake to less than 100 mmol/day (less than 2.3 gm of sodium or less than 6 gm of sodium choride)   This patient was seen by [facility] with Dr Lavera Guise as a part of collaborative care agreement  Meds ordered this encounter  Medications  . DISCONTD: Tiotropium Bromide Monohydrate (SPIRIVA RESPIMAT) 2.5 MCG/ACT AERS    Sig: Inhale 1 puff into the lungs daily at 6 (six) AM.    Dispense:  12 g    Refill:  3    Order Specific Question:   Supervising Provider    Answer:   Lavera Guise [place]  . budesonide-formoterol (SYMBICORT) 80-4.5 MCG/ACT inhaler    Sig: Inhale 2 puffs into the lungs daily as needed.    Dispense:  3 Inhaler    Refill:  3    Order Specific Question:   Supervising Provider  AnswerLavera Guise [[id number]]    Time spent: 42 Minutes      Dr Lavera Guise Internal medicine

## 2019-04-13 ENCOUNTER — Other Ambulatory Visit: Payer: Self-pay

## 2019-04-13 DIAGNOSIS — J449 Chronic obstructive pulmonary disease, unspecified: Secondary | ICD-10-CM

## 2019-04-13 MED ORDER — SPIRIVA RESPIMAT 2.5 MCG/ACT IN AERS: 2.0000 | INHALATION_SPRAY | Freq: Every day | RESPIRATORY_TRACT | 3 refills | Status: DC

## 2019-04-18 ENCOUNTER — Other Ambulatory Visit: Payer: Self-pay | Admitting: Internal Medicine

## 2019-04-18 DIAGNOSIS — Z1231 Encounter for screening mammogram for malignant neoplasm of breast: Secondary | ICD-10-CM

## 2019-04-21 HISTORY — PX: HIATAL HERNIA REPAIR: SHX195

## 2019-04-26 ENCOUNTER — Other Ambulatory Visit: Payer: Self-pay

## 2019-04-26 DIAGNOSIS — D0511 Intraductal carcinoma in situ of right breast: Secondary | ICD-10-CM

## 2019-05-03 ENCOUNTER — Other Ambulatory Visit: Payer: Self-pay | Admitting: *Deleted

## 2019-05-03 MED ORDER — LETROZOLE 2.5 MG PO TABS: 2.5000 mg | ORAL_TABLET | Freq: Every day | ORAL | 3 refills | Status: DC

## 2019-05-03 NOTE — Telephone Encounter (Signed)
One refill of Letrozole until her appt in sept, 2020

## 2019-05-18 MED ORDER — FP GAS RELIEF 180 MG PO CAPS
1.00 | ORAL_CAPSULE | ORAL | Status: DC
Start: 2019-05-19 — End: 2019-05-18

## 2019-05-18 MED ORDER — CELLULOSE SODIUM PHOSPHATE VI
5000.00 | Status: DC
Start: 2019-05-18 — End: 2019-05-18

## 2019-05-18 MED ORDER — Medication
40.00 | Status: DC
Start: 2019-05-19 — End: 2019-05-18

## 2019-05-18 MED ORDER — EQ LUBRICANT EYE DROPS OP
650.00 | OPHTHALMIC | Status: DC
Start: 2019-05-18 — End: 2019-05-18

## 2019-05-18 MED ORDER — CHLOROPHYLL EX
10.00 | CUTANEOUS | Status: DC
Start: ? — End: 2019-05-18

## 2019-05-18 MED ORDER — ISOVUE-M 300 61 % IJ SOLN
4.00 | INTRAMUSCULAR | Status: DC
Start: 2019-05-18 — End: 2019-05-18

## 2019-05-18 MED ORDER — PHENOL 1.4 % MT LIQD
2.00 | OROMUCOSAL | Status: DC
Start: ? — End: 2019-05-18

## 2019-05-18 MED ORDER — PHENYLEPHRINE-GUAIFENESIN 20-375 MG PO CP12
10.00 | ORAL_CAPSULE | ORAL | Status: DC
Start: ? — End: 2019-05-18

## 2019-05-18 MED ORDER — RICOLA HERB MT
80.00 | OROMUCOSAL | Status: DC
Start: ? — End: 2019-05-18

## 2019-05-18 MED ORDER — Medication
12.50 | Status: DC
Start: ? — End: 2019-05-18

## 2019-05-18 MED ORDER — VICON FORTE PO CAPS
17.00 | ORAL_CAPSULE | ORAL | Status: DC
Start: 2019-05-19 — End: 2019-05-18

## 2019-05-18 MED ORDER — Medication
Status: DC
Start: ? — End: 2019-05-18

## 2019-05-18 MED ORDER — HYOSOL/SL 0.125 MG SL SUBL
1.00 | SUBLINGUAL_TABLET | SUBLINGUAL | Status: DC
Start: 2019-05-19 — End: 2019-05-18

## 2019-05-18 MED ORDER — DINO-LIFE PO
50.00 | ORAL | Status: DC
Start: 2019-05-18 — End: 2019-05-18

## 2019-05-18 MED ORDER — BELLADONNA ALKALOIDS-OPIUM RE
10.00 | RECTAL | Status: DC
Start: ? — End: 2019-05-18

## 2019-05-18 MED ORDER — EQUATE NICOTINE 4 MG MT GUM
4.00 | CHEWING_GUM | OROMUCOSAL | Status: DC
Start: ? — End: 2019-05-18

## 2019-05-18 MED ORDER — HYDROMORPHONE HCL 1 MG/ML IJ SOLN
0.50 | INTRAMUSCULAR | Status: DC
Start: ? — End: 2019-05-18

## 2019-05-18 MED ORDER — SELECT BRAND INSULIN SYRINGE 29G X 1/2" 1 ML MISC
2.00 | Status: DC
Start: 2019-05-18 — End: 2019-05-18

## 2019-05-23 ENCOUNTER — Other Ambulatory Visit: Payer: Self-pay | Admitting: *Deleted

## 2019-05-23 ENCOUNTER — Telehealth: Payer: Self-pay | Admitting: Podiatry

## 2019-05-23 ENCOUNTER — Telehealth: Payer: Self-pay

## 2019-05-23 MED ORDER — MELOXICAM 7.5 MG PO TABS: 7.5000 mg | ORAL_TABLET | ORAL | 0 refills | Status: DC | PRN

## 2019-05-23 NOTE — Telephone Encounter (Signed)
Pt states she received a call from [place] regarding the refill of Mobic and that she needs to schedule an appointment to be seen. Pt stated she has not contacted her pharmacy about the refill. I told pt to just disregard.

## 2019-05-23 NOTE — Telephone Encounter (Signed)
Called patient, no answer but left a VM to  Inform her per Dr. Prudence Davidson that the prescription for Mobic 7.5mg  will be sent to pharmacy on file([facility], [address]) w/ no refills until an appt. has been sched.

## 2019-05-23 NOTE — Progress Notes (Signed)
Prescription has been sent in for Mobic-7.5mg .

## 2019-05-23 NOTE — Telephone Encounter (Signed)
Pt called that she had surgery for hernia and they stopped her bp med because she unable to swallow and she had GI tube as per heather advised need to check with surgeon also she need hospital follow up

## 2019-06-04 ENCOUNTER — Other Ambulatory Visit: Payer: Self-pay

## 2019-06-04 ENCOUNTER — Ambulatory Visit: Payer: Managed Care, Other (non HMO)

## 2019-06-04 MED ORDER — GABAPENTIN 300 MG PO CAPS: 300.0000 mg | ORAL_CAPSULE | Freq: Three times a day (TID) | ORAL | 0 refills | Status: DC

## 2019-06-19 ENCOUNTER — Ambulatory Visit
Admission: RE | Admit: 2019-06-19 | Discharge: 2019-06-19 | Disposition: A | Discharge status: Home / Self Care | Payer: Managed Care, Other (non HMO) | Source: Ambulatory Visit | Attending: Surgery | Admitting: Surgery

## 2019-06-19 ENCOUNTER — Telehealth: Payer: Self-pay

## 2019-06-19 DIAGNOSIS — D0511 Intraductal carcinoma in situ of right breast: Secondary | ICD-10-CM

## 2019-06-19 HISTORY — DX: Personal history of irradiation: Z92.3

## 2019-06-19 NOTE — Telephone Encounter (Signed)
Per Dr Dahlia Byes patient notified that her mammogram was normal.

## 2019-06-25 ENCOUNTER — Ambulatory Visit: Payer: Managed Care, Other (non HMO) | Admitting: Surgery

## 2019-07-04 ENCOUNTER — Other Ambulatory Visit: Payer: Self-pay

## 2019-07-04 ENCOUNTER — Ambulatory Visit (INDEPENDENT_AMBULATORY_CARE_PROVIDER_SITE_OTHER): Payer: Managed Care, Other (non HMO) | Admitting: Surgery

## 2019-07-04 ENCOUNTER — Encounter: Payer: Self-pay | Admitting: Surgery

## 2019-07-04 VITALS — BP 103/69 | HR 63 | Temp 97.0°F | Ht 59.0 in | Wt 175.0 lb

## 2019-07-04 DIAGNOSIS — D0511 Intraductal carcinoma in situ of right breast: Secondary | ICD-10-CM

## 2019-07-04 NOTE — Patient Instructions (Addendum)
Follow-up with our office as needed. Please call and ask to speak with a nurse if you develop questions or concerns.  Breast Self-Awareness Breast self-awareness means being familiar with how your breasts look and feel. It involves checking your breasts regularly and reporting any changes to your health care provider. Practicing breast self-awareness is important. Sometimes changes may not be harmful (are benign), but sometimes a change in your breasts can be a sign of a serious medical problem. It is important to learn how to do this procedure correctly so that you can catch problems early, when treatment is more likely to be successful. All women should practice breast self-awareness, including women who have had breast implants. What you need:  A mirror.  A well-lit room. How to do a breast self-exam A breast self-exam is one way to learn what is normal for your breasts and whether your breasts are changing. To do a breast self-exam: Look for changes  1. Remove all the clothing above your waist. 2. Stand in front of a mirror in a room with good lighting. 3. Put your hands on your hips. 4. Push your hands firmly downward. 5. Compare your breasts in the mirror. Look for differences between them (asymmetry), such as: ? Differences in shape. ? Differences in size. ? Puckers, dips, and bumps in one breast and not the other. 6. Look at each breast for changes in the skin, such as: ? Redness. ? Scaly areas. 7. Look for changes in your nipples, such as: ? Discharge. ? Bleeding. ? Dimpling. ? Redness. ? A change in position. Feel for changes Carefully feel your breasts for lumps and changes. It is best to do this while lying on your back on the floor, and again while sitting or standing in the tub or shower with soapy water on your skin. Feel each breast in the following way: 1. Place the arm on the side of the breast you are examining above your head. 2. Feel your breast with the  other hand. 3. Start in the nipple area and make -inch (2 cm) overlapping circles to feel your breast. Use the pads of your three middle fingers to do this. Apply light pressure, then medium pressure, then firm pressure. The light pressure will allow you to feel the tissue closest to the skin. The medium pressure will allow you to feel the tissue that is a little deeper. The firm pressure will allow you to feel the tissue close to the ribs. 4. Continue the overlapping circles, moving downward over the breast until you feel your ribs below your breast. 5. Move one finger-width toward the center of the body. Continue to use the -inch (2 cm) overlapping circles to feel your breast as you move slowly up toward your collarbone. 6. Continue the up-and-down exam using all three pressures until you reach your armpit.  Write down what you find Writing down what you find can help you remember what to discuss with your health care provider. Write down:  What is normal for each breast.  Any changes that you find in each breast, including: ? The kind of changes you find. ? Any pain or tenderness. ? Size and location of any lumps.  Where you are in your menstrual cycle, if you are still menstruating. General tips and recommendations  Examine your breasts every month.  If you are breastfeeding, the best time to examine your breasts is after a feeding or after using a breast pump.  If you menstruate, the   best time to examine your breasts is 5-7 days after your period. Breasts are generally lumpier during menstrual periods, and it may be more difficult to notice changes.  With time and practice, you will become more familiar with the variations in your breasts and more comfortable with the exam. Contact a health care provider if you:  See a change in the shape or size of your breasts or nipples.  See a change in the skin of your breast or nipples, such as a reddened or scaly area.  Have unusual  discharge from your nipples.  Find a lump or thick area that was not there before.  Have pain in your breasts.  Have any concerns related to your breast health. Summary  Breast self-awareness includes looking for physical changes in your breasts, as well as feeling for any changes within your breasts.  Breast self-awareness should be performed in front of a mirror in a well-lit room.  You should examine your breasts every month. If you menstruate, the best time to examine your breasts is 5-7 days after your menstrual period.  Let your health care provider know of any changes you notice in your breasts, including changes in size, changes on the skin, pain or tenderness, or unusual fluid from your nipples. This information is not intended to replace advice given to you by your health care provider. Make sure you discuss any questions you have with your health care provider. Document Released: 09/06/2005 Document Revised: 04/25/2018 Document Reviewed: 04/25/2018 Elsevier Patient Education  2020 Elsevier Inc.  

## 2019-07-09 NOTE — Progress Notes (Signed)
Outpatient Surgical Follow Up  07/09/2019  Patricia Le is an 63 y.o. female.   Chief Complaint  Patient presents with  . Follow-up    F/u mammogram 06/19/19    HPI: 63 year old female with a history of right breast cancer in with a grade DCIS ER PR positive status post lumpectomy and MammoSite radiation 2015. She comes today for a routine physical and review of her mammogram.  Please note that I have personally reviewed the mammogram showing no evidence of of any concerning areas.  She specifically denies any breast pain any breast discharge.  No masses or lymphadenopathy .  No fevers no chills no weight loss. She did have a hiatal hernia that was repair several weeks ago at [facility].  She still has some stitches from the gastrostomy tube  Past Medical History:  Diagnosis Date  . Anemia   . 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 Cancer [facility])    DCIS right breast  . Colon polyp   . GERD (gastroesophageal reflux disease)   . Hiatal hernia   . Hyperlipidemia   . Hypertension   . Personal history of radiation therapy   . Restless leg syndrome     Past Surgical History:  Procedure Laterality Date  . ABDOMINAL HYSTERECTOMY    . BACK SURGERY    . BREAST BIOPSY Right 2015   DCIS  . BREAST LUMPECTOMY Right 2015   DCIS with Mammosite  . BREAST MAMMOSITE Right 07-02-14  . BREAST SURGERY Right 06-06-14   Wide excision of Right breast DCIS  . CESAREAN SECTION    . CHOLECYSTECTOMY    . COLONOSCOPY     over 10 yrs ago/ Dr Kristopher Glee  . COLONOSCOPY  11-20-14   Dr Jamal Collin hyperplastic polyp  . TONSILLECTOMY    . TUBAL LIGATION      Family History  Problem Relation Age of Onset  . Diabetes Mother   . Cancer Father 33       melanoma  . Breast cancer Neg Hx     Social History:  reports that she has quit smoking. Her smoking use included cigarettes. She has a 20.00 pack-year smoking history. She has never used smokeless tobacco. She reports  current alcohol use. She reports that she does not use drugs.  Allergies:  Allergies  Allergen Reactions  . No Known Allergies     Medications reviewed.    ROS Full ROS performed and is otherwise negative other than what is stated in HPI   BP 103/69   Pulse 63   Temp (!) 97 F (36.1 C) (Temporal)   Ht 4\' 11"  (1.499 m)   Wt 175 lb (79.4 kg)   SpO2 98%   BMI 35.35 kg/m   Physical Exam Vitals signs and nursing note reviewed. Exam conducted with a chaperone present.  Constitutional:      General: She is not in acute distress.    Appearance: Normal appearance.  Eyes:     General: No scleral icterus.       Right eye: No discharge.        Left eye: No discharge.     Pupils: Pupils are equal, round, and reactive to light.  Neck:     Musculoskeletal: Normal range of motion. No neck rigidity.  Cardiovascular:     Rate and Rhythm: Normal rate and regular rhythm.  Pulmonary:     Effort: Pulmonary effort is normal.  Breath sounds: Normal breath sounds.     Comments: Prior lumpectomy site with some thickening of the breast tissue without any new concerning masses.  No evidence of nipple or skin lesions.  No evidence of lymphadenopathy Abdominal:     General: Abdomen is flat. There is no distension.     Palpations: There is no mass.     Hernia: No hernia is present.     Comments: Incisions from hiatal hernia c/d/i. Stitch from gastrostomy tube. No peritonitis  Musculoskeletal: Normal range of motion.        General: No swelling or deformity.  Skin:    General: Skin is warm and dry.     Capillary Refill: Capillary refill takes less than 2 seconds.     Coloration: Skin is not jaundiced.     Findings: No bruising.  Neurological:     General: No focal deficit present.     Mental Status: She is alert and oriented to person, place, and time.  Psychiatric:        Mood and Affect: Mood normal.        Behavior: Behavior normal.        Thought Content: Thought content normal.         Judgment: Judgment normal.      Assessment/Plan: History of right DCIS with no new concerning lesions.  Recommend yearly mammogram with follow-up physical exam as well as self examinations.  No need for any biopsies or surgical intervention at this time She may need a stitch removal in a couple weeks from the gastrostomy tube.  She does follow-up with [facility] for this  Greater than 50% of the 25 minutes  visit was spent in counseling/coordination of care   Caroleen Hamman, MD [place] [facility]

## 2019-07-19 ENCOUNTER — Ambulatory Visit (INDEPENDENT_AMBULATORY_CARE_PROVIDER_SITE_OTHER): Payer: Managed Care, Other (non HMO) | Admitting: Nurse Practitioner

## 2019-07-19 ENCOUNTER — Other Ambulatory Visit: Payer: Self-pay

## 2019-07-19 ENCOUNTER — Encounter: Payer: Self-pay | Admitting: Nurse Practitioner

## 2019-07-19 VITALS — BP 124/90 | HR 67 | Temp 97.6°F | Resp 16 | Ht 59.0 in | Wt 173.0 lb

## 2019-07-19 DIAGNOSIS — N390 Urinary tract infection, site not specified: Secondary | ICD-10-CM

## 2019-07-19 DIAGNOSIS — Z124 Encounter for screening for malignant neoplasm of cervix: Secondary | ICD-10-CM | POA: Diagnosis not present

## 2019-07-19 DIAGNOSIS — J449 Chronic obstructive pulmonary disease, unspecified: Secondary | ICD-10-CM

## 2019-07-19 DIAGNOSIS — Z0001 Encounter for general adult medical examination with abnormal findings: Secondary | ICD-10-CM | POA: Diagnosis not present

## 2019-07-19 DIAGNOSIS — I10 Essential (primary) hypertension: Secondary | ICD-10-CM | POA: Diagnosis not present

## 2019-07-19 DIAGNOSIS — R300 Dysuria: Secondary | ICD-10-CM

## 2019-07-19 NOTE — Progress Notes (Signed)
[facility] [place], [place] [address]  Internal MEDICINE  Office Visit Note  Patient Name: Patricia Le  [id number]  VN:[id number]  Date of Service: 08/04/2019   Pt is here for routine health maintenance examination  Chief Complaint  Patient presents with  . Annual Exam  . Gynecologic Exam  . Hypertension  . Hyperlipidemia  . Gastroesophageal Reflux     The patient is here for health maintenance exam and pap smear. She had surgery in August to repair significant hiatal hernia. Has had long recovery process. She states that she is doing well now. Her breathing is better. She is experiencing less shortness of breath. She is gradually increasing her regular physical activity. Blood pressures are well controlled. She has history of ductal carcinoma in situ of the breast. She has had her regular mammogram and follow up with surgeon and oncologist. She continues to be negative for breast cancer.     Current Medication: Outpatient Encounter Medications as of 07/19/2019  Medication Sig  . albuterol (VENTOLIN HFA) 108 (90 Base) MCG/ACT inhaler Inhale 2 puffs into the lungs 4 (four) times daily as needed for wheezing or shortness of breath.  . budesonide-formoterol (SYMBICORT) 80-4.5 MCG/ACT inhaler Inhale 2 puffs into the lungs daily as needed.  . ergocalciferol (DRISDOL) 1.25 MG (50000 UT) capsule Take 1 capsule (50,000 Units total) by mouth once a week.  . fluticasone (FLONASE) 50 MCG/ACT nasal spray 2 SPRAYS IN BOTH NOSTRILS EVERY DAY FOR RHINITIS  . gabapentin (NEURONTIN) 300 MG capsule Take 1 capsule (300 mg total) by mouth 3 (three) times daily.  . meloxicam (MOBIC) 7.5 MG tablet Take 1 tablet (7.5 mg total) by mouth as needed for pain.  . Tiotropium Bromide Monohydrate (SPIRIVA RESPIMAT) 2.5 MCG/ACT AERS Inhale 2 puffs into the lungs daily at 6 (six) AM.  . triamcinolone cream (KENALOG) 0.1 % Apply to all affected areas twice daily as needed  .  [DISCONTINUED] metoprolol tartrate (LOPRESSOR) 50 MG tablet Take 1 tablet (50 mg total) by mouth 2 (two) times daily.  Marland Kitchen sulfamethoxazole-trimethoprim (BACTRIM DS) 800-160 MG tablet Take 1 tablet by mouth 2 (two) times daily.   No facility-administered encounter medications on file as of 07/19/2019.     Surgical History: Past Surgical History:  Procedure Laterality Date  . ABDOMINAL HYSTERECTOMY    . BACK SURGERY    . BREAST BIOPSY Right 2015   DCIS  . BREAST LUMPECTOMY Right 2015   DCIS with Mammosite  . BREAST MAMMOSITE Right 07-02-14  . BREAST SURGERY Right 06-06-14   Wide excision of Right breast DCIS  . CESAREAN SECTION    . CHOLECYSTECTOMY    . COLONOSCOPY     over 10 yrs ago/ Dr Kristopher Glee  . COLONOSCOPY  11-20-14   Dr Jamal Collin hyperplastic polyp  . TONSILLECTOMY    . TUBAL LIGATION      Medical History: Past Medical History:  Diagnosis Date  . Anemia   . Breast cancer ([place]) 2015   9 mm intermediate grade DCIS; ER: 90%, PR: 1-15%  . Cancer [facility]    DCIS right breast  . Colon polyp   . GERD (gastroesophageal reflux disease)   . Hiatal hernia   . Hyperlipidemia   . Hypertension   . Personal history of radiation therapy   . Restless leg syndrome     Family History: Family History  Problem Relation Age of Onset  . Diabetes Mother   . Cancer Father 21  melanoma  . Breast cancer Neg Hx       Review of Systems  Constitutional: Negative for chills, fatigue and unexpected weight change.  HENT: Negative for congestion, postnasal drip, rhinorrhea, sneezing and sore throat.   Respiratory: Negative for cough, chest tightness, shortness of breath and wheezing.   Cardiovascular: Negative for chest pain and palpitations.  Gastrointestinal: Negative for abdominal pain, constipation, diarrhea, nausea and vomiting.  Endocrine: Negative for cold intolerance, heat intolerance, polydipsia and polyuria.  Genitourinary: Negative for dysuria and frequency.  Musculoskeletal:  Negative for arthralgias, back pain, joint swelling and neck pain.  Skin: Negative for rash.  Allergic/Immunologic: Negative for environmental allergies.  Neurological: Negative for dizziness, tremors, numbness and headaches.  Hematological: Negative for adenopathy. Does not bruise/bleed easily.  Psychiatric/Behavioral: Negative for behavioral problems (Depression), sleep disturbance and suicidal ideas. The patient is not nervous/anxious.      Today's Vitals   07/19/19 1442  BP: 124/90  Pulse: 67  Resp: 16  Temp: 97.6 F (36.4 C)  SpO2: 99%  Weight: 173 lb (78.5 kg)  Height: 4\' 11"  (1.499 m)   Body mass index is 34.94 kg/m.  Physical Exam Vitals signs and nursing note reviewed.  Constitutional:      General: She is not in acute distress.    Appearance: Normal appearance. She is well-developed. She is not diaphoretic.  HENT:     Head: Normocephalic and atraumatic.     Nose: Nose normal.     Mouth/Throat:     Pharynx: No oropharyngeal exudate.  Eyes:     Pupils: Pupils are equal, round, and reactive to light.  Neck:     Musculoskeletal: Normal range of motion and neck supple.     Thyroid: No thyromegaly.     Vascular: No carotid bruit or JVD.     Trachea: No tracheal deviation.  Cardiovascular:     Rate and Rhythm: Normal rate and regular rhythm.     Pulses: Normal pulses.     Heart sounds: Normal heart sounds. No murmur. No friction rub. No gallop.   Pulmonary:     Effort: Pulmonary effort is normal. No respiratory distress.     Breath sounds: Normal breath sounds. No wheezing or rales.  Chest:     Chest wall: No tenderness.     Breasts:        Right: Normal. No swelling, bleeding, inverted nipple, mass, nipple discharge, skin change or tenderness.        Left: Normal. No swelling, bleeding, inverted nipple, mass, nipple discharge, skin change or tenderness.  Abdominal:     General: Bowel sounds are normal.     Palpations: Abdomen is soft.     Tenderness: There  is no abdominal tenderness.  Genitourinary:    General: Normal vulva.     Exam position: Supine.     Labia:        Right: No tenderness or lesion.        Left: No tenderness or lesion.      Vagina: Normal. No vaginal discharge, erythema or tenderness.     Cervix: No cervical motion tenderness, discharge, friability or erythema.     Uterus: Normal.      Adnexa: Right adnexa normal and left adnexa normal.     Comments: No tenderness, masses, or organomeglay present during bimanual exam . Musculoskeletal: Normal range of motion.  Lymphadenopathy:     Cervical: No cervical adenopathy.     Lower Body: No right inguinal adenopathy. No left  inguinal adenopathy.  Skin:    General: Skin is warm and dry.  Neurological:     Mental Status: She is alert and oriented to person, place, and time.     Cranial Nerves: No cranial nerve deficit.  Psychiatric:        Behavior: Behavior normal.        Thought Content: Thought content normal.        Judgment: Judgment normal.    LABS: Recent Results (from the past 2160 hour(s))  UA/M w/rflx Culture, Routine     Status: Abnormal   Collection Time: 07/19/19 12:00 AM   Specimen: Urine   URINE  Result Value Ref Range   Specific Gravity, UA 1.027 1.005 - 1.030   pH, UA 5.0 5.0 - 7.5   Color, UA Yellow Yellow   Appearance Ur Clear Clear   Leukocytes,UA 1+ (A) Negative   Protein,UA Negative Negative/Trace   Glucose, UA Negative Negative   Ketones, UA Negative Negative   RBC, UA Negative Negative   Bilirubin, UA Negative Negative   Urobilinogen, Ur 1.0 0.2 - 1.0 mg/dL   Nitrite, UA Negative Negative   Microscopic Examination See below:     Comment: Microscopic was indicated and was performed.   Urinalysis Reflex Comment     Comment: This specimen has reflexed to a Urine Culture.  Pap IG and HPV (high risk) DNA detection     Status: None   Collection Time: 07/19/19 12:00 AM  Result Value Ref Range   Interpretation NILM,QC     Comment: NEGATIVE  FOR INTRAEPITHELIAL LESION OR MALIGNANCY. THIS SPECIMEN WAS RESCREENED AS PART OF OUR QUALITY CONTROL PROGRAM.    Category NIL     Comment: Negative for Intraepithelial Lesion   Adequacy SPHHR     Comment: Satisfactory for evaluation. No endocervical cells are present. This is consistent with a history of hysterectomy.    Clinician Provided ICD10 Comment     Comment: Z12.4   Performed by: Comment     Comment: Charlynne Cousins, Cytotechnologist (ASCP)   QC reviewed by: Comment     Comment: Lamar Benes, Cytotechnologist (ASCP)   Note: Comment     Comment: The Pap smear is a screening test designed to aid in the detection of premalignant and malignant conditions of the uterine cervix.  It is not a diagnostic procedure and should not be used as the sole means of detecting cervical cancer.  Both false-positive and false-negative reports do occur.    Test Methodology Comment     Comment: This liquid based ThinPrep(R) pap test was screened with the use of an image guided system.    HPV, high-risk Negative Negative    Comment: This nucleic acid amplification high-risk HPV test detects thirteen high-risk types (16,18,31,33,35,39,45,51,52,56,58,59,68) without differentiation.   Microscopic Examination     Status: Abnormal   Collection Time: 07/19/19 12:00 AM   URINE  Result Value Ref Range   WBC, UA 6-10 (A) 0 - 5 /hpf   RBC 0-2 0 - 2 /hpf   Epithelial Cells (non renal) 0-10 0 - 10 /hpf   Casts None seen None seen /lpf   Crystals Present (A) N/A   Crystal Type Calcium Oxalate N/A   Mucus, UA Present Not Estab.   Bacteria, UA Few None seen/Few  Urine Culture, Reflex     Status: Abnormal   Collection Time: 07/19/19 12:00 AM   URINE  Result Value Ref Range   Urine Culture, Routine Final report (A)  Organism ID, Bacteria Escherichia coli (A)     Comment: 50,000-100,000 colony forming units per mL Cefazolin <=4 ug/mL Cefazolin with an MIC <=16 predicts susceptibility to the oral  agents cefaclor, cefdinir, cefpodoxime, cefprozil, cefuroxime, cephalexin, and loracarbef when used for therapy of uncomplicated urinary tract infections due to E. coli, Klebsiella pneumoniae, and Proteus mirabilis.    Antimicrobial Susceptibility Comment     Comment:       ** S = Susceptible; I = Intermediate; R = Resistant **                    P = Positive; N = Negative             MICS are expressed in micrograms per mL    Antibiotic                 RSLT#1    RSLT#2    RSLT#3    RSLT#4 Amoxicillin/Clavulanic Acid    I Ampicillin                     R Cefepime                       S Ceftriaxone                    S Cefuroxime                     S Ciprofloxacin                  S Ertapenem                      S Gentamicin                     S Imipenem                       S Levofloxacin                   S Meropenem                      S Nitrofurantoin                 S Piperacillin/Tazobactam        S Tetracycline                   S Tobramycin                     S Trimethoprim/Sulfa             S     Assessment/Plan: 1. Encounter for general adult medical examination with abnormal findings Annual health maintenance exam with pap smear today.   2. Essential hypertension Well controlled. Continue bp medication as prescribed   3. Chronic obstructive pulmonary disease, unspecified COPD type ([place]) Stable. Continue inhalers as prescribed   4. Routine cervical smear - Pap IG and HPV (high risk) DNA detection  5. Urinary tract infection without hematuria, site unspecified Start bactrim DS bid for 7 days.  - sulfamethoxazole-trimethoprim (BACTRIM DS) 800-160 MG tablet; Take 1 tablet by mouth 2 (two) times daily.  Dispense: 14 tablet; Refill: 0  6. Dysuria - UA/M w/rflx Culture, Routine  General Counseling: Sanya verbalizes understanding of the findings of todays visit and agrees with  plan of treatment. I have discussed any further diagnostic evaluation that may be  needed or ordered today. We also reviewed her medications today. she has been encouraged to call the office with any questions or concerns that should arise related to todays visit.    Counseling:  Hypertension Counseling:   The following hypertensive lifestyle modification were recommended and discussed:  1. Limiting alcohol intake to less than 1 oz/day of ethanol:(24 oz of beer or 8 oz of wine or 2 oz of 100-proof whiskey). 2. Take baby ASA 81 mg daily. 3. Importance of regular aerobic exercise and losing weight. 4. Reduce dietary saturated fat and cholesterol intake for overall cardiovascular health. 5. Maintaining adequate dietary potassium, calcium, and magnesium intake. 6. Regular monitoring of the blood pressure. 7. Reduce sodium intake to less than 100 mmol/day (less than 2.3 gm of sodium or less than 6 gm of sodium choride)   This patient was seen by [place] with Dr Lavera Guise as a part of collaborative care agreement  Orders Placed This Encounter  Procedures  . Microscopic Examination  . Urine Culture, Reflex  . UA/M w/rflx Culture, Routine    Meds ordered this encounter  Medications  . sulfamethoxazole-trimethoprim (BACTRIM DS) 800-160 MG tablet    Sig: Take 1 tablet by mouth 2 (two) times daily.    Dispense:  14 tablet    Refill:  0    Order Specific Question:   Supervising Provider    Answer:   Lavera Guise [id number]    Time spent: [place], MD  Internal Medicine

## 2019-07-20 NOTE — Progress Notes (Signed)
Waiting on urine culture and sensitivity results.

## 2019-07-22 LAB — MICROSCOPIC EXAMINATION: Casts: NONE SEEN /lpf

## 2019-07-22 LAB — UA/M W/RFLX CULTURE, ROUTINE
Bilirubin, UA: NEGATIVE
Glucose, UA: NEGATIVE
Ketones, UA: NEGATIVE
Nitrite, UA: NEGATIVE
Protein,UA: NEGATIVE
RBC, UA: NEGATIVE
Specific Gravity, UA: 1.027 (ref 1.005–1.030)
Urobilinogen, Ur: 1 mg/dL (ref 0.2–1.0)
pH, UA: 5 (ref 5.0–7.5)

## 2019-07-22 LAB — URINE CULTURE, REFLEX

## 2019-07-22 MED ORDER — SULFAMETHOXAZOLE-TRIMETHOPRIM 800-160 MG PO TABS: 1.0000 | ORAL_TABLET | Freq: Two times a day (BID) | ORAL | 0 refills | Status: DC

## 2019-07-22 NOTE — Progress Notes (Signed)
Please let the patient know that urine sample from cpe showed infection. I have sent prescription for bactrim ds bid for 7 days to [facility] in [place] for her. Thanks.

## 2019-07-23 ENCOUNTER — Telehealth: Payer: Self-pay

## 2019-07-23 NOTE — Telephone Encounter (Signed)
Pt advised urine did showed UTI  We send bactrim

## 2019-07-23 NOTE — Telephone Encounter (Signed)
-----   Message from Ronnell Freshwater, NP sent at 07/22/2019  6:18 PM EST ----- Please let the patient know that urine sample from cpe showed infection. I have sent prescription for bactrim ds bid for 7 days to [facility] in [place] for her. Thanks.

## 2019-07-27 ENCOUNTER — Telehealth: Payer: Self-pay

## 2019-07-27 LAB — PAP IG AND HPV HIGH-RISK: HPV, high-risk: NEGATIVE

## 2019-07-27 NOTE — Telephone Encounter (Signed)
-----   Message from Ronnell Freshwater, NP sent at 07/27/2019 10:02 AM EST ----- Please let the patient know that pap smear was normal. thanks

## 2019-07-27 NOTE — Telephone Encounter (Signed)
Pt advised pap smear came back normal

## 2019-07-27 NOTE — Progress Notes (Signed)
Please let the patient know that pap smear was normal. thanks

## 2019-08-03 ENCOUNTER — Other Ambulatory Visit: Payer: Self-pay | Admitting: Nurse Practitioner

## 2019-08-03 DIAGNOSIS — I10 Essential (primary) hypertension: Secondary | ICD-10-CM

## 2019-08-03 MED ORDER — METOPROLOL TARTRATE 50 MG PO TABS: 50.0000 mg | ORAL_TABLET | Freq: Two times a day (BID) | ORAL | 1 refills | Status: DC

## 2019-08-04 DIAGNOSIS — Z124 Encounter for screening for malignant neoplasm of cervix: Secondary | ICD-10-CM | POA: Insufficient documentation

## 2019-08-04 DIAGNOSIS — N390 Urinary tract infection, site not specified: Secondary | ICD-10-CM | POA: Insufficient documentation

## 2019-08-23 ENCOUNTER — Other Ambulatory Visit: Payer: Self-pay

## 2019-08-23 MED ORDER — GABAPENTIN 300 MG PO CAPS: 300.0000 mg | ORAL_CAPSULE | Freq: Three times a day (TID) | ORAL | 0 refills | Status: DC

## 2019-08-30 ENCOUNTER — Other Ambulatory Visit: Payer: Self-pay

## 2019-08-30 DIAGNOSIS — Z20822 Contact with and (suspected) exposure to covid-19: Secondary | ICD-10-CM

## 2019-09-01 LAB — NOVEL CORONAVIRUS, NAA: SARS-CoV-2, NAA: NOT DETECTED

## 2019-10-04 ENCOUNTER — Other Ambulatory Visit: Payer: Self-pay | Admitting: Nurse Practitioner

## 2019-10-05 LAB — COMPREHENSIVE METABOLIC PANEL
ALT: 16 IU/L (ref 0–32)
AST: 16 IU/L (ref 0–40)
Albumin/Globulin Ratio: 1.6 (ref 1.2–2.2)
Albumin: 4.5 g/dL (ref 3.8–4.8)
Alkaline Phosphatase: 82 IU/L (ref 39–117)
BUN/Creatinine Ratio: 17 (ref 12–28)
BUN: 12 mg/dL (ref 8–27)
Bilirubin Total: 0.4 mg/dL (ref 0.0–1.2)
CO2: 24 mmol/L (ref 20–29)
Calcium: 10 mg/dL (ref 8.7–10.3)
Chloride: 103 mmol/L (ref 96–106)
Creatinine, Ser: 0.7 mg/dL (ref 0.57–1.00)
GFR calc Af Amer: 108 mL/min/{1.73_m2} (ref >59)
GFR calc non Af Amer: 94 mL/min/{1.73_m2} (ref >59)
Globulin, Total: 2.8 g/dL (ref 1.5–4.5)
Glucose: 114 mg/dL — ABNORMAL HIGH (ref 65–99)
Potassium: 4.5 mmol/L (ref 3.5–5.2)
Sodium: 141 mmol/L (ref 134–144)
Total Protein: 7.3 g/dL (ref 6.0–8.5)

## 2019-10-05 LAB — LIPID PANEL W/O CHOL/HDL RATIO
Cholesterol, Total: 224 mg/dL — ABNORMAL HIGH (ref 100–199)
HDL: 51 mg/dL (ref >39)
LDL Chol Calc (NIH): 142 mg/dL — ABNORMAL HIGH (ref 0–99)
Triglycerides: 174 mg/dL — ABNORMAL HIGH (ref 0–149)
VLDL Cholesterol Cal: 31 mg/dL (ref 5–40)

## 2019-10-05 LAB — T4, FREE: Free T4: 1.23 ng/dL (ref 0.82–1.77)

## 2019-10-05 LAB — CBC
Hematocrit: 41.6 % (ref 34.0–46.6)
Hemoglobin: 14.1 g/dL (ref 11.1–15.9)
MCH: 29.7 pg (ref 26.6–33.0)
MCHC: 33.9 g/dL (ref 31.5–35.7)
MCV: 88 fL (ref 79–97)
Platelets: 289 10*3/uL (ref 150–450)
RBC: 4.75 x10E6/uL (ref 3.77–5.28)
RDW: 12.8 % (ref 11.7–15.4)
WBC: 6.4 10*3/uL (ref 3.4–10.8)

## 2019-10-05 LAB — TSH: TSH: 2.44 u[IU]/mL (ref 0.450–4.500)

## 2019-10-05 LAB — VITAMIN D 25 HYDROXY (VIT D DEFICIENCY, FRACTURES): Vit D, 25-Hydroxy: 20.6 ng/mL — ABNORMAL LOW (ref 30.0–100.0)

## 2019-10-16 ENCOUNTER — Other Ambulatory Visit: Payer: Self-pay | Admitting: Nurse Practitioner

## 2019-10-16 DIAGNOSIS — E559 Vitamin D deficiency, unspecified: Secondary | ICD-10-CM

## 2019-10-16 MED ORDER — ERGOCALCIFEROL 1.25 MG (50000 UT) PO CAPS: 50000.0000 [IU] | ORAL_CAPSULE | ORAL | 1 refills | Status: DC

## 2019-10-16 NOTE — Progress Notes (Signed)
Sent new prescription for Drisdol 50000 iu weekly to [facility] due to vitamin d deficiency present on labs.

## 2019-10-16 NOTE — Progress Notes (Signed)
Cholesterol coming down. Looks good. I Sent new prescription for Drisdol 50000 iu weekly to [facility] due to vitamin d deficiency present on labs. All other labs were good.

## 2019-11-12 ENCOUNTER — Other Ambulatory Visit: Payer: Self-pay

## 2019-11-12 MED ORDER — GABAPENTIN 300 MG PO CAPS: 300.0000 mg | ORAL_CAPSULE | Freq: Three times a day (TID) | ORAL | 0 refills | Status: DC

## 2019-11-15 ENCOUNTER — Telehealth: Payer: Self-pay

## 2019-11-15 NOTE — Telephone Encounter (Signed)
LMOM TO CONFIRM AND SCREEN FOR 11-19-19 OV.

## 2019-11-19 ENCOUNTER — Other Ambulatory Visit: Payer: Self-pay

## 2019-11-19 ENCOUNTER — Ambulatory Visit (INDEPENDENT_AMBULATORY_CARE_PROVIDER_SITE_OTHER): Payer: Managed Care, Other (non HMO) | Admitting: Nurse Practitioner

## 2019-11-19 ENCOUNTER — Encounter: Payer: Self-pay | Admitting: Nurse Practitioner

## 2019-11-19 VITALS — BP 135/89 | HR 70 | Temp 97.3°F | Resp 16 | Ht 59.0 in | Wt 177.4 lb

## 2019-11-19 DIAGNOSIS — E559 Vitamin D deficiency, unspecified: Secondary | ICD-10-CM

## 2019-11-19 DIAGNOSIS — J449 Chronic obstructive pulmonary disease, unspecified: Secondary | ICD-10-CM | POA: Diagnosis not present

## 2019-11-19 DIAGNOSIS — Z6834 Body mass index (BMI) 34.0-34.9, adult: Secondary | ICD-10-CM

## 2019-11-19 DIAGNOSIS — I10 Essential (primary) hypertension: Secondary | ICD-10-CM

## 2019-11-19 MED ORDER — PHENTERMINE HCL 37.5 MG PO TABS: 37.5000 mg | ORAL_TABLET | Freq: Every day | ORAL | 0 refills | Status: DC

## 2019-11-19 NOTE — Progress Notes (Signed)
[facility] [place], [place] [address]  Internal MEDICINE  Office Visit Note  Patient Name: Patricia Le  [id number]  VN:[id number]  Date of Service: 11/21/2019  Chief Complaint  Patient presents with  . Hypertension  . Gastroesophageal Reflux  . Hyperlipidemia    The patient is here for routine follow up visit. She is concerned about recent weight gain. Has gained four pounds since her last visit here. She has not been as active and states that she consistently craves "junk food."  She was on phentermine in the past. We stopped this because she had been having trouble with her breathing and chest discomfort. Since she had repair of large hiatal hernia, this has become much better. Blood pressure is well controlled. She is interested in trying this again for a few months to see if this will help curb cravings and get her in right direction for weight loss.       Current Medication: Outpatient Encounter Medications as of 11/19/2019  Medication Sig  . albuterol (VENTOLIN HFA) 108 (90 Base) MCG/ACT inhaler Inhale 2 puffs into the lungs 4 (four) times daily as needed for wheezing or shortness of breath.  . budesonide-formoterol (SYMBICORT) 80-4.5 MCG/ACT inhaler Inhale 2 puffs into the lungs daily as needed.  . ergocalciferol (DRISDOL) 1.25 MG (50000 UT) capsule Take 1 capsule (50,000 Units total) by mouth once a week.  . fluticasone (FLONASE) 50 MCG/ACT nasal spray 2 SPRAYS IN BOTH NOSTRILS EVERY DAY FOR RHINITIS  . gabapentin (NEURONTIN) 300 MG capsule Take 1 capsule (300 mg total) by mouth 3 (three) times daily.  . meloxicam (MOBIC) 7.5 MG tablet Take 1 tablet (7.5 mg total) by mouth as needed for pain.  . metoprolol tartrate (LOPRESSOR) 50 MG tablet Take 1 tablet (50 mg total) by mouth 2 (two) times daily.  Marland Kitchen sulfamethoxazole-trimethoprim (BACTRIM DS) 800-160 MG tablet Take 1 tablet by mouth 2 (two) times daily.  . Tiotropium Bromide Monohydrate (SPIRIVA  RESPIMAT) 2.5 MCG/ACT AERS Inhale 2 puffs into the lungs daily at 6 (six) AM.  . triamcinolone cream (KENALOG) 0.1 % Apply to all affected areas twice daily as needed  . phentermine (ADIPEX-P) 37.5 MG tablet Take 1 tablet (37.5 mg total) by mouth daily before breakfast.   No facility-administered encounter medications on file as of 11/19/2019.    Surgical History: Past Surgical History:  Procedure Laterality Date  . ABDOMINAL HYSTERECTOMY    . BACK SURGERY    . BREAST BIOPSY Right 2015   DCIS  . BREAST LUMPECTOMY Right 2015   DCIS with Mammosite  . BREAST MAMMOSITE Right 07-02-14  . BREAST SURGERY Right 06-06-14   Wide excision of Right breast DCIS  . CESAREAN SECTION    . CHOLECYSTECTOMY    . COLONOSCOPY     over 10 yrs ago/ Dr Kristopher Glee  . COLONOSCOPY  11-20-14   Dr Jamal Collin hyperplastic polyp  . TONSILLECTOMY    . TUBAL LIGATION      Medical History: Past Medical History:  Diagnosis Date  . Anemia   . 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 Cancer [facility])    DCIS right breast  . Colon polyp   . GERD (gastroesophageal reflux disease)   . Hiatal hernia   . Hyperlipidemia   . Hypertension   . Personal history of radiation therapy   . Restless leg syndrome     Family History: Family History  Problem  Relation Age of Onset  . Diabetes Mother   . Cancer Father 38       melanoma  . Breast cancer Neg Hx     Social History   Socioeconomic History  . Marital status: Divorced    Spouse name: Not on file  . Number of children: Not on file  . Years of education: Not on file  . Highest education level: Not on file  Occupational History  . Not on file  Tobacco Use  . Smoking status: Former Smoker    Packs/day: 1.00    Years: 20.00    Pack years: 20.00    Types: Cigarettes  . Smokeless tobacco: Never Used  Substance and Sexual Activity  . Alcohol use: Yes    Alcohol/week: 0.0 standard drinks    Comment: occasionally  . Drug  use: Never  . Sexual activity: Not on file  Other Topics Concern  . Not on file  Social History Narrative  . Not on file   Social Determinants of Health   Financial Resource Strain:   . Difficulty of Paying Living Expenses: Not on file  Food Insecurity:   . Worried About Charity fundraiser in the Last Year: Not on file  . Ran Out of Food in the Last Year: Not on file  Transportation Needs:   . Lack of Transportation (Medical): Not on file  . Lack of Transportation (Non-Medical): Not on file  Physical Activity:   . Days of Exercise per Week: Not on file  . Minutes of Exercise per Session: Not on file  Stress:   . Feeling of Stress : Not on file  Social Connections:   . Frequency of Communication with Friends and Family: Not on file  . Frequency of Social Gatherings with Friends and Family: Not on file  . Attends Religious Services: Not on file  . Active Member of Clubs or Organizations: Not on file  . Attends Archivist Meetings: Not on file  . Marital Status: Not on file  Intimate Partner Violence:   . Fear of Current or Ex-Partner: Not on file  . Emotionally Abused: Not on file  . Physically Abused: Not on file  . Sexually Abused: Not on file      Review of Systems  Constitutional: Negative for chills, fatigue and unexpected weight change.       Weight gain four pounds since her last visit.  HENT: Negative for congestion, postnasal drip, rhinorrhea, sneezing and sore throat.   Respiratory: Negative for cough, chest tightness, shortness of breath and wheezing.   Cardiovascular: Negative for chest pain and palpitations.  Gastrointestinal: Negative for abdominal pain, constipation, diarrhea, nausea and vomiting.  Endocrine: Negative for cold intolerance, heat intolerance, polydipsia and polyuria.  Musculoskeletal: Negative for arthralgias, back pain, joint swelling and neck pain.  Skin: Negative for rash.  Allergic/Immunologic: Negative for environmental  allergies.  Neurological: Negative for dizziness, tremors, numbness and headaches.  Hematological: Negative for adenopathy. Does not bruise/bleed easily.  Psychiatric/Behavioral: Negative for behavioral problems (Depression), sleep disturbance and suicidal ideas. The patient is not nervous/anxious.     Today's Vitals   11/19/19 1558  BP: 135/89  Pulse: 70  Resp: 16  Temp: (!) 97.3 F (36.3 C)  SpO2: 99%  Weight: 177 lb 6.4 oz (80.5 kg)  Height: 4\' 11"  (1.499 m)   Body mass index is 35.83 kg/m.  Physical Exam Vitals and nursing note reviewed.  Constitutional:      General: She is  not in acute distress.    Appearance: Normal appearance. She is well-developed. She is not diaphoretic.  HENT:     Head: Normocephalic and atraumatic.     Mouth/Throat:     Pharynx: No oropharyngeal exudate.  Eyes:     Extraocular Movements: Extraocular movements intact.     Pupils: Pupils are equal, round, and reactive to light.  Neck:     Thyroid: No thyromegaly.     Vascular: No JVD.     Trachea: No tracheal deviation.  Cardiovascular:     Rate and Rhythm: Normal rate and regular rhythm.     Heart sounds: Normal heart sounds. No murmur. No friction rub. No gallop.   Pulmonary:     Effort: Pulmonary effort is normal. No respiratory distress.     Breath sounds: Normal breath sounds. No wheezing or rales.  Chest:     Chest wall: No tenderness.  Abdominal:     General: Bowel sounds are normal.     Palpations: Abdomen is soft.     Tenderness: There is no abdominal tenderness.  Musculoskeletal:        General: Normal range of motion.     Cervical back: Normal range of motion and neck supple.     Comments: Significant scoliosis and kyphosis present. She is at her baseline.   Lymphadenopathy:     Cervical: No cervical adenopathy.  Skin:    General: Skin is warm and dry.  Neurological:     Mental Status: She is alert and oriented to person, place, and time.     Cranial Nerves: No cranial  nerve deficit.  Psychiatric:        Mood and Affect: Mood normal.        Behavior: Behavior normal.        Thought Content: Thought content normal.        Judgment: Judgment normal.    Assessment/Plan:  1. Essential hypertension Stable. Continue metoprolol as prescribed   2. Chronic obstructive pulmonary disease, unspecified COPD type ([place]) Well managed. Continue to use inhalers as prescribed   3. BMI 34.0-34.9,adult Trial phentermine 37.5mg  tablets daily. Limit calorie intake to 1200 calories per day. Incorporate exercise into daily routine. Patient to stope medication if negative side effects occur. - phentermine (ADIPEX-P) 37.5 MG tablet; Take 1 tablet (37.5 mg total) by mouth daily before breakfast.  Dispense: 30 tablet; Refill: 0  4. Vitamin D deficiency Continue with drisdol 50000 iu weekly.  General Counseling: Lavita verbalizes understanding of the findings of todays visit and agrees with plan of treatment. I have discussed any further diagnostic evaluation that may be needed or ordered today. We also reviewed her medications today. she has been encouraged to call the office with any questions or concerns that should arise related to todays visit.    There is a liability release in patients' chart. There has been a 10 minute discussion about the side effects including but not limited to elevated blood pressure, anxiety, lack of sleep and dry mouth. Pt understands and will like to start/continue on appetite suppressant at this time. There will be one month RX given at the time of visit with proper follow up. Nova diet plan with restricted calories is given to the pt. Pt understands and agrees with  plan of treatment  This patient was seen by Leretha Pol FNP Collaboration with Dr Lavera Guise as a part of collaborative care agreement  Meds ordered this encounter  Medications  . phentermine (ADIPEX-P) 37.5  MG tablet    Sig: Take 1 tablet (37.5 mg total) by mouth daily  before breakfast.    Dispense:  30 tablet    Refill:  0    Written prescription provided to patient .    Order Specific Question:   Supervising Provider    Answer:   Lavera Guise [id number]    Total time spent: 30 Minutes   Time spent includes review of chart, medications, test results, and follow up plan with the patient.      Dr Lavera Guise Internal medicine

## 2019-12-18 ENCOUNTER — Telehealth: Payer: Self-pay

## 2019-12-18 NOTE — Telephone Encounter (Signed)
CONFIRMED AND SCREENED FOR 12-20-19 OV. 

## 2019-12-20 ENCOUNTER — Other Ambulatory Visit: Payer: Self-pay

## 2019-12-20 ENCOUNTER — Ambulatory Visit: Payer: Managed Care, Other (non HMO) | Admitting: Nurse Practitioner

## 2019-12-20 ENCOUNTER — Encounter: Payer: Self-pay | Admitting: Nurse Practitioner

## 2019-12-20 VITALS — BP 123/86 | HR 75 | Temp 97.3°F | Resp 16 | Ht 59.0 in | Wt 175.0 lb

## 2019-12-20 DIAGNOSIS — I10 Essential (primary) hypertension: Secondary | ICD-10-CM

## 2019-12-20 DIAGNOSIS — Z6835 Body mass index (BMI) 35.0-35.9, adult: Secondary | ICD-10-CM

## 2019-12-20 DIAGNOSIS — J449 Chronic obstructive pulmonary disease, unspecified: Secondary | ICD-10-CM | POA: Diagnosis not present

## 2019-12-20 MED ORDER — PHENTERMINE HCL 37.5 MG PO TABS: 37.5000 mg | ORAL_TABLET | Freq: Every day | ORAL | 0 refills | Status: DC

## 2019-12-20 NOTE — Progress Notes (Signed)
[facility] [place], [place] [address]  Internal MEDICINE  Office Visit Note  Patient Name: Patricia Le  [id number]  VN:[id number]  Date of Service: 01/05/2020  Chief Complaint  Patient presents with  . Hypertension  . Follow-up    weight management    The patient is here for follow up. She was restarted on phentermine at her most recent visit to help manage weight. She has lost two pounds since she was last seen. She has increased her physical activity and cut back her calorie intake to 1200 calories per day. Her blood pressure remains very well managed and her breathing is also doing well.       Current Medication: Outpatient Encounter Medications as of 12/20/2019  Medication Sig  . albuterol (VENTOLIN HFA) 108 (90 Base) MCG/ACT inhaler Inhale 2 puffs into the lungs 4 (four) times daily as needed for wheezing or shortness of breath.  . budesonide-formoterol (SYMBICORT) 80-4.5 MCG/ACT inhaler Inhale 2 puffs into the lungs daily as needed.  . ergocalciferol (DRISDOL) 1.25 MG (50000 UT) capsule Take 1 capsule (50,000 Units total) by mouth once a week.  . fluticasone (FLONASE) 50 MCG/ACT nasal spray 2 SPRAYS IN BOTH NOSTRILS EVERY DAY FOR RHINITIS  . gabapentin (NEURONTIN) 300 MG capsule Take 1 capsule (300 mg total) by mouth 3 (three) times daily.  . meloxicam (MOBIC) 7.5 MG tablet Take 1 tablet (7.5 mg total) by mouth as needed for pain.  . metoprolol tartrate (LOPRESSOR) 50 MG tablet Take 1 tablet (50 mg total) by mouth 2 (two) times daily.  . phentermine (ADIPEX-P) 37.5 MG tablet Take 1 tablet (37.5 mg total) by mouth daily before breakfast.  . Tiotropium Bromide Monohydrate (SPIRIVA RESPIMAT) 2.5 MCG/ACT AERS Inhale 2 puffs into the lungs daily at 6 (six) AM.  . triamcinolone cream (KENALOG) 0.1 % Apply to all affected areas twice daily as needed  . [DISCONTINUED] phentermine (ADIPEX-P) 37.5 MG tablet Take 1 tablet (37.5 mg total) by mouth daily before  breakfast.  . [DISCONTINUED] sulfamethoxazole-trimethoprim (BACTRIM DS) 800-160 MG tablet Take 1 tablet by mouth 2 (two) times daily. (Patient not taking: Reported on 12/20/2019)   No facility-administered encounter medications on file as of 12/20/2019.    Surgical History: Past Surgical History:  Procedure Laterality Date  . ABDOMINAL HYSTERECTOMY    . BACK SURGERY    . BREAST BIOPSY Right 2015   DCIS  . BREAST LUMPECTOMY Right 2015   DCIS with Mammosite  . BREAST MAMMOSITE Right 07-02-14  . BREAST SURGERY Right 06-06-14   Wide excision of Right breast DCIS  . CESAREAN SECTION    . CHOLECYSTECTOMY    . COLONOSCOPY     over 10 yrs ago/ Dr Kristopher Glee  . COLONOSCOPY  11-20-14   Dr Jamal Collin hyperplastic polyp  . TONSILLECTOMY    . TUBAL LIGATION      Medical History: Past Medical History:  Diagnosis Date  . Anemia   . 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 Cancer [facility])    DCIS right breast  . Colon polyp   . GERD (gastroesophageal reflux disease)   . Hiatal hernia   . Hyperlipidemia   . Hypertension   . Personal history of radiation therapy   . Restless leg syndrome     Family History: Family History  Problem Relation Age of Onset  . Diabetes Mother   . Cancer Father 40  melanoma  . Breast cancer Neg Hx     Social History   Socioeconomic History  . Marital status: Divorced    Spouse name: Not on file  . Number of children: Not on file  . Years of education: Not on file  . Highest education level: Not on file  Occupational History  . Not on file  Tobacco Use  . Smoking status: Former Smoker    Packs/day: 1.00    Years: 20.00    Pack years: 20.00    Types: Cigarettes  . Smokeless tobacco: Never Used  Substance and Sexual Activity  . Alcohol use: Yes    Alcohol/week: 0.0 standard drinks    Comment: occasionally  . Drug use: Never  . Sexual activity: Not on file  Other Topics Concern  . Not on file  Social  History Narrative  . Not on file   Social Determinants of Health   Financial Resource Strain:   . Difficulty of Paying Living Expenses:   Food Insecurity:   . Worried About Charity fundraiser in the Last Year:   . Arboriculturist in the Last Year:   Transportation Needs:   . Film/video editor (Medical):   Marland Kitchen Lack of Transportation (Non-Medical):   Physical Activity:   . Days of Exercise per Week:   . Minutes of Exercise per Session:   Stress:   . Feeling of Stress :   Social Connections:   . Frequency of Communication with Friends and Family:   . Frequency of Social Gatherings with Friends and Family:   . Attends Religious Services:   . Active Member of Clubs or Organizations:   . Attends Archivist Meetings:   Marland Kitchen Marital Status:   Intimate Partner Violence:   . Fear of Current or Ex-Partner:   . Emotionally Abused:   Marland Kitchen Physically Abused:   . Sexually Abused:       Review of Systems  Constitutional: Negative for chills, fatigue and unexpected weight change.       Two pound weight loss since her last visit.   HENT: Negative for congestion, postnasal drip, rhinorrhea, sneezing and sore throat.   Respiratory: Negative for cough, chest tightness, shortness of breath and wheezing.   Cardiovascular: Negative for chest pain and palpitations.  Gastrointestinal: Negative for abdominal pain, constipation, diarrhea, nausea and vomiting.  Endocrine: Negative for cold intolerance, heat intolerance, polydipsia and polyuria.  Musculoskeletal: Negative for arthralgias, back pain, joint swelling and neck pain.  Skin: Negative for rash.  Allergic/Immunologic: Negative for environmental allergies.  Neurological: Negative for dizziness, tremors, numbness and headaches.  Hematological: Negative for adenopathy. Does not bruise/bleed easily.  Psychiatric/Behavioral: Negative for behavioral problems (Depression), sleep disturbance and suicidal ideas. The patient is not  nervous/anxious.     Today's Vitals   12/20/19 1613  BP: 123/86  Pulse: 75  Resp: 16  Temp: (!) 97.3 F (36.3 C)  SpO2: 98%  Weight: 175 lb (79.4 kg)  Height: 4\' 11"  (1.499 m)   Body mass index is 35.35 kg/m.  Physical Exam Vitals and nursing note reviewed.  Constitutional:      General: She is not in acute distress.    Appearance: Normal appearance. She is well-developed. She is not diaphoretic.  HENT:     Head: Normocephalic and atraumatic.     Mouth/Throat:     Pharynx: No oropharyngeal exudate.  Eyes:     Extraocular Movements: Extraocular movements intact.     Pupils: Pupils  are equal, round, and reactive to light.  Neck:     Thyroid: No thyromegaly.     Vascular: No JVD.     Trachea: No tracheal deviation.  Cardiovascular:     Rate and Rhythm: Normal rate and regular rhythm.     Heart sounds: Normal heart sounds. No murmur. No friction rub. No gallop.   Pulmonary:     Effort: Pulmonary effort is normal. No respiratory distress.     Breath sounds: Normal breath sounds. No wheezing or rales.  Chest:     Chest wall: No tenderness.  Abdominal:     Palpations: Abdomen is soft.  Musculoskeletal:        General: Normal range of motion.     Cervical back: Normal range of motion and neck supple.     Comments: Significant scoliosis and kyphosis present. She is at her baseline.   Lymphadenopathy:     Cervical: No cervical adenopathy.  Skin:    General: Skin is warm and dry.  Neurological:     Mental Status: She is alert and oriented to person, place, and time.     Cranial Nerves: No cranial nerve deficit.  Psychiatric:        Mood and Affect: Mood normal.        Behavior: Behavior normal.        Thought Content: Thought content normal.        Judgment: Judgment normal.    Assessment/Plan: 1. Essential hypertension Well managed. Continue bp medication as prescribed   2. Chronic obstructive pulmonary disease, unspecified COPD type ([place]) Doing well. Continue  with inhalers as prescribed   3. BMI 35.0-35.9,adult Improved. May continue phentermine every day. Limit calorie intake to 1200 calories per day and exercise routinely.  - phentermine (ADIPEX-P) 37.5 MG tablet; Take 1 tablet (37.5 mg total) by mouth daily before breakfast.  Dispense: 30 tablet; Refill: 0  General Counseling: Patricia Le verbalizes understanding of the findings of todays visit and agrees with plan of treatment. I have discussed any further diagnostic evaluation that may be needed or ordered today. We also reviewed her medications today. she has been encouraged to call the office with any questions or concerns that should arise related to todays visit.   There is a liability release in patients' chart. There has been a 10 minute discussion about the side effects including but not limited to elevated blood pressure, anxiety, lack of sleep and dry mouth. Pt understands and will like to start/continue on appetite suppressant at this time. There will be one month RX given at the time of visit with proper follow up. Nova diet plan with restricted calories is given to the pt. Pt understands and agrees with  plan of treatment  This patient was seen by Leretha Pol FNP Collaboration with Dr Lavera Guise as a part of collaborative care agreement  Meds ordered this encounter  Medications  . phentermine (ADIPEX-P) 37.5 MG tablet    Sig: Take 1 tablet (37.5 mg total) by mouth daily before breakfast.    Dispense:  30 tablet    Refill:  0    Written rx given to patient.    Order Specific Question:   Supervising Provider    Answer:   Lavera Guise [id number]    Total time spent: 20 Minutes   Time spent includes review of chart, medications, test results, and follow up plan with the patient.      Dr Lavera Guise Internal medicine

## 2020-01-11 ENCOUNTER — Other Ambulatory Visit: Payer: Self-pay | Admitting: Internal Medicine

## 2020-01-11 DIAGNOSIS — I10 Essential (primary) hypertension: Secondary | ICD-10-CM

## 2020-01-30 ENCOUNTER — Telehealth: Payer: Self-pay

## 2020-01-30 NOTE — Telephone Encounter (Signed)
Confirmed and screened for 02-01-20 ov. 

## 2020-02-01 ENCOUNTER — Ambulatory Visit: Payer: Managed Care, Other (non HMO) | Admitting: Nurse Practitioner

## 2020-02-01 ENCOUNTER — Other Ambulatory Visit: Payer: Self-pay

## 2020-02-01 ENCOUNTER — Encounter: Payer: Self-pay | Admitting: Nurse Practitioner

## 2020-02-01 VITALS — BP 120/80 | HR 72 | Temp 97.5°F | Resp 16 | Ht 59.0 in | Wt 174.6 lb

## 2020-02-01 DIAGNOSIS — I10 Essential (primary) hypertension: Secondary | ICD-10-CM | POA: Diagnosis not present

## 2020-02-01 DIAGNOSIS — Z6835 Body mass index (BMI) 35.0-35.9, adult: Secondary | ICD-10-CM | POA: Diagnosis not present

## 2020-02-01 DIAGNOSIS — J449 Chronic obstructive pulmonary disease, unspecified: Secondary | ICD-10-CM

## 2020-02-01 MED ORDER — PHENTERMINE HCL 37.5 MG PO TABS: 37.5000 mg | ORAL_TABLET | Freq: Every day | ORAL | 1 refills | Status: DC

## 2020-02-01 MED ORDER — GABAPENTIN 300 MG PO CAPS: 300.0000 mg | ORAL_CAPSULE | Freq: Three times a day (TID) | ORAL | 1 refills | Status: DC

## 2020-02-01 NOTE — Progress Notes (Signed)
[facility] [place], [place] [address]  Internal MEDICINE  Office Visit Note  Patient Name: GEORGIANNA LWIN  [id number]  VN:[id number]  Date of Service: 02/09/2020  Chief Complaint  Patient presents with  . Follow-up    bump under right breast  . Gastroesophageal Reflux  . Hypertension  . Hyperlipidemia    Patient here for routine follow up. She is taking phentermine 37.5mg  daily. She has lost additional one pound since her last visit. She has no noted negative side effects from taking this. She has not started walking yet. She is doing well with her diet and limiting her calorie intake to 1200 calories per day. Blood pressure remains well controlled. COPD has also remained well managed since having surgery to repair significant hiatal hernia.       Current Medication: Outpatient Encounter Medications as of 02/01/2020  Medication Sig  . albuterol (VENTOLIN HFA) 108 (90 Base) MCG/ACT inhaler Inhale 2 puffs into the lungs 4 (four) times daily as needed for wheezing or shortness of breath.  . budesonide-formoterol (SYMBICORT) 80-4.5 MCG/ACT inhaler Inhale 2 puffs into the lungs daily as needed.  . ergocalciferol (DRISDOL) 1.25 MG (50000 UT) capsule Take 1 capsule (50,000 Units total) by mouth once a week.  . fluticasone (FLONASE) 50 MCG/ACT nasal spray 2 SPRAYS IN BOTH NOSTRILS EVERY DAY FOR RHINITIS  . gabapentin (NEURONTIN) 300 MG capsule Take 1 capsule (300 mg total) by mouth 3 (three) times daily.  . meloxicam (MOBIC) 7.5 MG tablet Take 1 tablet (7.5 mg total) by mouth as needed for pain.  . metoprolol tartrate (LOPRESSOR) 50 MG tablet TAKE 1 TABLET BY MOUTH  TWICE DAILY  . phentermine (ADIPEX-P) 37.5 MG tablet Take 1 tablet (37.5 mg total) by mouth daily before breakfast.  . Tiotropium Bromide Monohydrate (SPIRIVA RESPIMAT) 2.5 MCG/ACT AERS Inhale 2 puffs into the lungs daily at 6 (six) AM.  . triamcinolone cream (KENALOG) 0.1 % Apply to all affected areas  twice daily as needed  . [DISCONTINUED] phentermine (ADIPEX-P) 37.5 MG tablet Take 1 tablet (37.5 mg total) by mouth daily before breakfast.   No facility-administered encounter medications on file as of 02/01/2020.    Surgical History: Past Surgical History:  Procedure Laterality Date  . ABDOMINAL HYSTERECTOMY    . BACK SURGERY    . BREAST BIOPSY Right 2015   DCIS  . BREAST LUMPECTOMY Right 2015   DCIS with Mammosite  . BREAST MAMMOSITE Right 07-02-14  . BREAST SURGERY Right 06-06-14   Wide excision of Right breast DCIS  . CESAREAN SECTION    . CHOLECYSTECTOMY    . COLONOSCOPY     over 10 yrs ago/ Dr Kristopher Glee  . COLONOSCOPY  11-20-14   Dr Jamal Collin hyperplastic polyp  . TONSILLECTOMY    . TUBAL LIGATION      Medical History: Past Medical History:  Diagnosis Date  . Anemia   . 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 Cancer [facility])    DCIS right breast  . Colon polyp   . GERD (gastroesophageal reflux disease)   . Hiatal hernia   . Hyperlipidemia   . Hypertension   . Personal history of radiation therapy   . Restless leg syndrome     Family History: Family History  Problem Relation Age of Onset  . Diabetes Mother   . Cancer Father 4       melanoma  . Breast cancer Neg  Hx     Social History   Socioeconomic History  . Marital status: Divorced    Spouse name: Not on file  . Number of children: Not on file  . Years of education: Not on file  . Highest education level: Not on file  Occupational History  . Not on file  Tobacco Use  . Smoking status: Former Smoker    Packs/day: 1.00    Years: 20.00    Pack years: 20.00    Types: Cigarettes  . Smokeless tobacco: Never Used  Substance and Sexual Activity  . Alcohol use: Yes    Alcohol/week: 0.0 standard drinks    Comment: occasionally  . Drug use: Never  . Sexual activity: Not on file  Other Topics Concern  . Not on file  Social History Narrative  . Not on file    Social Determinants of Health   Financial Resource Strain:   . Difficulty of Paying Living Expenses:   Food Insecurity:   . Worried About Charity fundraiser in the Last Year:   . Arboriculturist in the Last Year:   Transportation Needs:   . Film/video editor (Medical):   Marland Kitchen Lack of Transportation (Non-Medical):   Physical Activity:   . Days of Exercise per Week:   . Minutes of Exercise per Session:   Stress:   . Feeling of Stress :   Social Connections:   . Frequency of Communication with Friends and Family:   . Frequency of Social Gatherings with Friends and Family:   . Attends Religious Services:   . Active Member of Clubs or Organizations:   . Attends Archivist Meetings:   Marland Kitchen Marital Status:   Intimate Partner Violence:   . Fear of Current or Ex-Partner:   . Emotionally Abused:   Marland Kitchen Physically Abused:   . Sexually Abused:       Review of Systems  Constitutional: Negative for chills, fatigue and unexpected weight change.       One pound weight loss since her last visit.   HENT: Negative for congestion, postnasal drip, rhinorrhea, sneezing and sore throat.   Respiratory: Negative for cough, chest tightness, shortness of breath and wheezing.   Cardiovascular: Negative for chest pain and palpitations.  Gastrointestinal: Negative for abdominal pain, constipation, diarrhea, nausea and vomiting.  Endocrine: Negative for cold intolerance, heat intolerance, polydipsia and polyuria.  Musculoskeletal: Negative for arthralgias, back pain, joint swelling and neck pain.  Skin: Negative for rash.  Allergic/Immunologic: Negative for environmental allergies.  Neurological: Negative for dizziness, tremors, numbness and headaches.  Hematological: Negative for adenopathy. Does not bruise/bleed easily.  Psychiatric/Behavioral: Negative for behavioral problems (Depression), sleep disturbance and suicidal ideas. The patient is not nervous/anxious.     Today's Vitals    02/01/20 1544  BP: 120/80  Pulse: 72  Resp: 16  Temp: (!) 97.5 F (36.4 C)  SpO2: 100%  Weight: 174 lb 9.6 oz (79.2 kg)  Height: 4\' 11"  (1.499 m)   Body mass index is 35.26 kg/m.  Physical Exam Vitals and nursing note reviewed.  Constitutional:      General: She is not in acute distress.    Appearance: Normal appearance. She is well-developed. She is not diaphoretic.  HENT:     Head: Normocephalic and atraumatic.     Nose: Nose normal.     Mouth/Throat:     Pharynx: No oropharyngeal exudate.  Eyes:     Extraocular Movements: Extraocular movements intact.  Pupils: Pupils are equal, round, and reactive to light.  Neck:     Thyroid: No thyromegaly.     Vascular: No JVD.     Trachea: No tracheal deviation.  Cardiovascular:     Rate and Rhythm: Normal rate and regular rhythm.     Heart sounds: Normal heart sounds. No murmur. No friction rub. No gallop.   Pulmonary:     Effort: Pulmonary effort is normal. No respiratory distress.     Breath sounds: Normal breath sounds. No wheezing or rales.  Chest:     Chest wall: No tenderness.  Abdominal:     Palpations: Abdomen is soft.  Musculoskeletal:        General: Normal range of motion.     Cervical back: Normal range of motion and neck supple.     Comments: Significant scoliosis and kyphosis present. She is at her baseline.   Lymphadenopathy:     Cervical: No cervical adenopathy.  Skin:    General: Skin is warm and dry.  Neurological:     Mental Status: She is alert. She is disoriented.     Cranial Nerves: No cranial nerve deficit.  Psychiatric:        Mood and Affect: Mood normal.        Behavior: Behavior normal.        Thought Content: Thought content normal.        Judgment: Judgment normal.   Assessment/Plan:  1. Essential hypertension Stable. Continue bp medication as prescribed   2. Chronic obstructive pulmonary disease, unspecified COPD type ([place]) Doing well. Continue inhalers and respiratory medication  as prescribed   3. BMI 35.0-35.9,adult Improving. May continue to take phentermine daily. Limit calorie intake to 1200 calories per day. Gradually incorporate exercise into daily routine.  - phentermine (ADIPEX-P) 37.5 MG tablet; Take 1 tablet (37.5 mg total) by mouth daily before breakfast.  Dispense: 30 tablet; Refill: 1  General Counseling: Jacqueli verbalizes understanding of the findings of todays visit and agrees with plan of treatment. I have discussed any further diagnostic evaluation that may be needed or ordered today. We also reviewed her medications today. she has been encouraged to call the office with any questions or concerns that should arise related to todays visit.   There is a liability release in patients' chart. There has been a 10 minute discussion about the side effects including but not limited to elevated blood pressure, anxiety, lack of sleep and dry mouth. Pt understands and will like to start/continue on appetite suppressant at this time. There will be one month RX given at the time of visit with proper follow up. Nova diet plan with restricted calories is given to the pt. Pt understands and agrees with  plan of treatment  This patient was seen by Leretha Pol FNP Collaboration with Dr Lavera Guise as a part of collaborative care agreement  Meds ordered this encounter  Medications  . phentermine (ADIPEX-P) 37.5 MG tablet    Sig: Take 1 tablet (37.5 mg total) by mouth daily before breakfast.    Dispense:  30 tablet    Refill:  1    Written rx given to patient.    Order Specific Question:   Supervising Provider    Answer:   Lavera Guise [id number]    Total time spent: 20 Minutes   Time spent includes review of chart, medications, test results, and follow up plan with the patient.      Dr Lavera Guise Internal  medicine

## 2020-03-13 ENCOUNTER — Telehealth: Payer: Self-pay

## 2020-03-13 NOTE — Telephone Encounter (Signed)
Authorization has been approved for Phentermine HCI 37.5 MG Tablets from 02/19/2020 through 08/20/2020 [facility]

## 2020-03-14 ENCOUNTER — Ambulatory Visit: Payer: Managed Care, Other (non HMO) | Admitting: Nurse Practitioner

## 2020-03-14 ENCOUNTER — Other Ambulatory Visit: Payer: Self-pay

## 2020-03-14 ENCOUNTER — Encounter: Payer: Self-pay | Admitting: Nurse Practitioner

## 2020-03-14 VITALS — BP 131/87 | HR 74 | Temp 97.4°F | Resp 16 | Ht 59.0 in | Wt 174.0 lb

## 2020-03-14 DIAGNOSIS — J449 Chronic obstructive pulmonary disease, unspecified: Secondary | ICD-10-CM

## 2020-03-14 DIAGNOSIS — I10 Essential (primary) hypertension: Secondary | ICD-10-CM | POA: Diagnosis not present

## 2020-03-14 DIAGNOSIS — Z6835 Body mass index (BMI) 35.0-35.9, adult: Secondary | ICD-10-CM

## 2020-03-14 MED ORDER — PHENTERMINE HCL 37.5 MG PO TABS: 37.5000 mg | ORAL_TABLET | Freq: Every day | ORAL | 1 refills | Status: DC

## 2020-03-14 NOTE — Progress Notes (Signed)
[facility] [place], [place] [address]  Internal MEDICINE  Office Visit Note  Patient Name: Patricia Le  [id number]  [id number]  Date of Service: 03/24/2020  Chief Complaint  Patient presents with  . Follow-up    weight mangemnt    Patient here for routine follow up. She is taking phentermine 37.5mg  daily. Her weight has been stabel since her last visit. She has no noted negative side effects from taking this. Admits she has not started walking yet. She is doing well with her diet and limiting her calorie intake to 1200 calories per day. Blood pressure remains well controlled. COPD has also remained well managed since having surgery to repair significant hiatal hernia. She has had no problems with asthma or COPD.       Current Medication: Outpatient Encounter Medications as of 03/14/2020  Medication Sig  . albuterol (VENTOLIN HFA) 108 (90 Base) MCG/ACT inhaler Inhale 2 puffs into the lungs 4 (four) times daily as needed for wheezing or shortness of breath.  . budesonide-formoterol (SYMBICORT) 80-4.5 MCG/ACT inhaler Inhale 2 puffs into the lungs daily as needed.  . ergocalciferol (DRISDOL) 1.25 MG (50000 UT) capsule Take 1 capsule (50,000 Units total) by mouth once a week.  . fluticasone (FLONASE) 50 MCG/ACT nasal spray 2 SPRAYS IN BOTH NOSTRILS EVERY DAY FOR RHINITIS  . gabapentin (NEURONTIN) 300 MG capsule Take 1 capsule (300 mg total) by mouth 3 (three) times daily.  . meloxicam (MOBIC) 7.5 MG tablet Take 1 tablet (7.5 mg total) by mouth as needed for pain.  . metoprolol tartrate (LOPRESSOR) 50 MG tablet TAKE 1 TABLET BY MOUTH  TWICE DAILY  . phentermine (ADIPEX-P) 37.5 MG tablet Take 1 tablet (37.5 mg total) by mouth daily before breakfast.  . Tiotropium Bromide Monohydrate (SPIRIVA RESPIMAT) 2.5 MCG/ACT AERS Inhale 2 puffs into the lungs daily at 6 (six) AM.  . triamcinolone cream (KENALOG) 0.1 % Apply to all affected areas twice daily as needed  .  [DISCONTINUED] phentermine (ADIPEX-P) 37.5 MG tablet Take 1 tablet (37.5 mg total) by mouth daily before breakfast.   No facility-administered encounter medications on file as of 03/14/2020.    Surgical History: Past Surgical History:  Procedure Laterality Date  . ABDOMINAL HYSTERECTOMY    . BACK SURGERY    . BREAST BIOPSY Right 2015   DCIS  . BREAST LUMPECTOMY Right 2015   DCIS with Mammosite  . BREAST MAMMOSITE Right 07-02-14  . BREAST SURGERY Right 06-06-14   Wide excision of Right breast DCIS  . CESAREAN SECTION    . CHOLECYSTECTOMY    . COLONOSCOPY     over 10 yrs ago/ Dr Kristopher Glee  . COLONOSCOPY  11-20-14   Dr Jamal Collin hyperplastic polyp  . TONSILLECTOMY    . TUBAL LIGATION      Medical History: Past Medical History:  Diagnosis Date  . Anemia   . 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 Cancer [facility])    DCIS right breast  . Colon polyp   . GERD (gastroesophageal reflux disease)   . Hiatal hernia   . Hyperlipidemia   . Hypertension   . Personal history of radiation therapy   . Restless leg syndrome     Family History: Family History  Problem Relation Age of Onset  . Diabetes Mother   . Cancer Father 88       melanoma  . Breast cancer Neg Hx  Social History   Socioeconomic History  . Marital status: Divorced    Spouse name: Not on file  . Number of children: Not on file  . Years of education: Not on file  . Highest education level: Not on file  Occupational History  . Not on file  Tobacco Use  . Smoking status: Former Smoker    Packs/day: 1.00    Years: 20.00    Pack years: 20.00    Types: Cigarettes  . Smokeless tobacco: Never Used  Substance and Sexual Activity  . Alcohol use: Yes    Alcohol/week: 0.0 standard drinks    Comment: occasionally  . Drug use: Never  . Sexual activity: Not on file  Other Topics Concern  . Not on file  Social History Narrative  . Not on file   Social Determinants of Health    Financial Resource Strain:   . Difficulty of Paying Living Expenses:   Food Insecurity:   . Worried About Charity fundraiser in the Last Year:   . Arboriculturist in the Last Year:   Transportation Needs:   . Film/video editor (Medical):   Marland Kitchen Lack of Transportation (Non-Medical):   Physical Activity:   . Days of Exercise per Week:   . Minutes of Exercise per Session:   Stress:   . Feeling of Stress :   Social Connections:   . Frequency of Communication with Friends and Family:   . Frequency of Social Gatherings with Friends and Family:   . Attends Religious Services:   . Active Member of Clubs or Organizations:   . Attends Archivist Meetings:   Marland Kitchen Marital Status:   Intimate Partner Violence:   . Fear of Current or Ex-Partner:   . Emotionally Abused:   Marland Kitchen Physically Abused:   . Sexually Abused:       Review of Systems  Constitutional: Negative for chills, fatigue and unexpected weight change.  HENT: Negative for congestion, postnasal drip, rhinorrhea, sneezing and sore throat.   Respiratory: Negative for cough, chest tightness, shortness of breath and wheezing.   Cardiovascular: Negative for chest pain and palpitations.  Gastrointestinal: Negative for abdominal pain, constipation, diarrhea, nausea and vomiting.  Endocrine: Negative for cold intolerance, heat intolerance, polydipsia and polyuria.  Musculoskeletal: Negative for arthralgias, back pain, joint swelling and neck pain.  Skin: Negative for rash.  Allergic/Immunologic: Negative for environmental allergies.  Neurological: Negative for dizziness, tremors, numbness and headaches.  Hematological: Negative for adenopathy. Does not bruise/bleed easily.  Psychiatric/Behavioral: Negative for behavioral problems (Depression), sleep disturbance and suicidal ideas. The patient is not nervous/anxious.     Today's Vitals   03/14/20 1610  BP: 131/87  Pulse: 74  Resp: 16  Temp: (!) 97.4 F (36.3 C)  SpO2:  100%  Weight: 174 lb (78.9 kg)  Height: 4\' 11"  (1.499 m)   Body mass index is 35.14 kg/m.  Physical Exam Vitals and nursing note reviewed.  Constitutional:      General: She is not in acute distress.    Appearance: Normal appearance. She is well-developed. She is not diaphoretic.  HENT:     Head: Normocephalic and atraumatic.     Nose: Nose normal.     Mouth/Throat:     Pharynx: No oropharyngeal exudate.  Eyes:     Extraocular Movements: Extraocular movements intact.     Pupils: Pupils are equal, round, and reactive to light.  Neck:     Thyroid: No thyromegaly.  Vascular: No JVD.     Trachea: No tracheal deviation.  Cardiovascular:     Rate and Rhythm: Normal rate and regular rhythm.     Heart sounds: Normal heart sounds. No murmur heard.  No friction rub. No gallop.   Pulmonary:     Effort: Pulmonary effort is normal. No respiratory distress.     Breath sounds: Normal breath sounds. No wheezing or rales.  Chest:     Chest wall: No tenderness.  Abdominal:     Palpations: Abdomen is soft.  Musculoskeletal:        General: Normal range of motion.     Cervical back: Normal range of motion and neck supple.     Comments: Significant scoliosis and kyphosis present. She is at her baseline.   Lymphadenopathy:     Cervical: No cervical adenopathy.  Skin:    General: Skin is warm and dry.  Neurological:     Mental Status: She is alert. She is disoriented.     Cranial Nerves: No cranial nerve deficit.  Psychiatric:        Mood and Affect: Mood normal.        Behavior: Behavior normal.        Thought Content: Thought content normal.        Judgment: Judgment normal.    Assessment/Plan:  1. Essential hypertension Stable. Continue bp medication as prescribed   2. Chronic obstructive pulmonary disease, unspecified COPD type ([place]) Well managed. Use inhalers as prescribed   3. BMI 35.0-35.9,adult No changes. May continue phentermine 37.5mg  tablets daily. Limit calorie  intake to 1200 calories per day. Encouraged her to begin incorporatig exercise into daily routine.  - phentermine (ADIPEX-P) 37.5 MG tablet; Take 1 tablet (37.5 mg total) by mouth daily before breakfast.  Dispense: 30 tablet; Refill: 1   General Counseling: Promise verbalizes understanding of the findings of todays visit and agrees with plan of treatment. I have discussed any further diagnostic evaluation that may be needed or ordered today. We also reviewed her medications today. she has been encouraged to call the office with any questions or concerns that should arise related to todays visit.    There is a liability release in patients' chart. There has been a 10 minute discussion about the side effects including but not limited to elevated blood pressure, anxiety, lack of sleep and dry mouth. Pt understands and will like to start/continue on appetite suppressant at this time. There will be one month RX given at the time of visit with proper follow up. Nova diet plan with restricted calories is given to the pt. Pt understands and agrees with  plan of treatment  This patient was seen by Leretha Pol FNP Collaboration with Dr Lavera Guise as a part of collaborative care agreement  Meds ordered this encounter  Medications  . phentermine (ADIPEX-P) 37.5 MG tablet    Sig: Take 1 tablet (37.5 mg total) by mouth daily before breakfast.    Dispense:  30 tablet    Refill:  1    Written rx given to patient.    Order Specific Question:   Supervising Provider    Answer:   Lavera Guise [[id number]]    Total time spent: 20 Minutes   Time spent includes review of chart, medications, test results, and follow up plan with the patient.      Dr Lavera Guise Internal medicine

## 2020-04-25 ENCOUNTER — Telehealth: Payer: Self-pay

## 2020-04-25 ENCOUNTER — Ambulatory Visit: Payer: Managed Care, Other (non HMO) | Admitting: Nurse Practitioner

## 2020-04-25 NOTE — Telephone Encounter (Signed)
Confirmed and screened for 04-29-20 ov. 

## 2020-04-29 ENCOUNTER — Other Ambulatory Visit: Payer: Self-pay

## 2020-04-29 ENCOUNTER — Ambulatory Visit: Payer: Managed Care, Other (non HMO) | Admitting: Nurse Practitioner

## 2020-04-29 ENCOUNTER — Encounter: Payer: Self-pay | Admitting: Nurse Practitioner

## 2020-04-29 VITALS — BP 121/82 | HR 73 | Temp 97.5°F | Resp 16 | Ht 59.0 in | Wt 175.0 lb

## 2020-04-29 DIAGNOSIS — Z6835 Body mass index (BMI) 35.0-35.9, adult: Secondary | ICD-10-CM

## 2020-04-29 DIAGNOSIS — J449 Chronic obstructive pulmonary disease, unspecified: Secondary | ICD-10-CM | POA: Diagnosis not present

## 2020-04-29 DIAGNOSIS — I10 Essential (primary) hypertension: Secondary | ICD-10-CM

## 2020-04-29 MED ORDER — PHENTERMINE HCL 37.5 MG PO TABS: 37.5000 mg | ORAL_TABLET | Freq: Every day | ORAL | 1 refills | Status: DC

## 2020-04-29 NOTE — Progress Notes (Signed)
[facility] [place], [place] [address]  Internal MEDICINE  Office Visit Note  Patient Name: Patricia Le  [id number]  [id number]  Date of Service: 04/29/2020  Chief Complaint  Patient presents with   Follow-up    6 week follow up   Gastroesophageal Reflux   Hyperlipidemia   Hypertension   Quality Metric Gaps    hep C screen    The patient is here for follow up of weight management. She has had a one pound weight gain since her last visit. Two weeks ago, her brother passed away, unexpectedly. The following week, her daughter fell while skating with her kids and broke her ankle. She had been in good routine with exercise and limiting calorie intake. With the passing of her brother, the patient got very off track with routine, both regarding diet and exercise. She continues to have good control blood pressure and COPD. Rarely needs to use rescue inhaler. Her daughter is having surgery this morning to repair her ankle. After initial recovery, the patient plans to get back to her routine, as she had been doing very well with appetite suppressant and weight loss.       Current Medication: Outpatient Encounter Medications as of 04/29/2020  Medication Sig   albuterol (VENTOLIN HFA) 108 (90 Base) MCG/ACT inhaler Inhale 2 puffs into the lungs 4 (four) times daily as needed for wheezing or shortness of breath.   budesonide-formoterol (SYMBICORT) 80-4.5 MCG/ACT inhaler Inhale 2 puffs into the lungs daily as needed.   ergocalciferol (DRISDOL) 1.25 MG (50000 UT) capsule Take 1 capsule (50,000 Units total) by mouth once a week.   fluticasone (FLONASE) 50 MCG/ACT nasal spray 2 SPRAYS IN BOTH NOSTRILS EVERY DAY FOR RHINITIS   gabapentin (NEURONTIN) 300 MG capsule Take 1 capsule (300 mg total) by mouth 3 (three) times daily.   meloxicam (MOBIC) 7.5 MG tablet Take 1 tablet (7.5 mg total) by mouth as needed for pain.   metoprolol tartrate (LOPRESSOR) 50 MG tablet  TAKE 1 TABLET BY MOUTH  TWICE DAILY   phentermine (ADIPEX-P) 37.5 MG tablet Take 1 tablet (37.5 mg total) by mouth daily before breakfast.   Tiotropium Bromide Monohydrate (SPIRIVA RESPIMAT) 2.5 MCG/ACT AERS Inhale 2 puffs into the lungs daily at 6 (six) AM.   triamcinolone cream (KENALOG) 0.1 % Apply to all affected areas twice daily as needed   [DISCONTINUED] phentermine (ADIPEX-P) 37.5 MG tablet Take 1 tablet (37.5 mg total) by mouth daily before breakfast.   No facility-administered encounter medications on file as of 04/29/2020.    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; 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%, 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%   Cancer ([place])    DCIS right breast   Colon polyp    GERD (gastroesophageal  reflux disease)    Hiatal hernia    Hyperlipidemia    Hypertension    Personal history of radiation therapy    Restless leg syndrome     Family History: Family History  Problem Relation Age of Onset   Diabetes Mother    Cancer Father 53       melanoma   Breast cancer Neg Hx     Social History   Socioeconomic History   Marital status: Divorced    Spouse name: Not on file   Number of children: Not on file   Years of education: Not on file   Highest education level: Not on file  Occupational History   Not on file  Tobacco Use   Smoking status: Former Smoker    Packs/day: 1.00    Years: 20.00     Pack years: 20.00    Types: Cigarettes   Smokeless tobacco: Never Used  Substance and Sexual Activity   Alcohol use: Yes    Alcohol/week: 0.0 standard drinks    Comment: occasionally   Drug use: Never   Sexual activity: Not on file  Other Topics Concern   Not on file  Social History Narrative   Not on file   Social Determinants of Health   Financial Resource Strain:    Difficulty of Paying Living Expenses:   Food Insecurity:    Worried About Charity fundraiser in the Last Year:    Arboriculturist in the Last Year:   Transportation Needs:    Film/video editor (Medical):    Lack of Transportation (Non-Medical):   Physical Activity:    Days of Exercise per Week:    Minutes of Exercise per Session:   Stress:    Feeling of Stress :   Social Connections:    Frequency of Communication with Friends and Family:    Frequency of Social Gatherings with Friends and Family:    Attends Religious Services:    Active Member of Clubs or Organizations:    Attends Music therapist:    Marital Status:   Intimate Partner Violence:    Fear of Current or Ex-Partner:    Emotionally Abused:    Physically Abused:    Sexually Abused:       Review of Systems  Constitutional: Negative for chills, fatigue and unexpected weight change.  HENT: Negative for congestion, postnasal drip, rhinorrhea, sneezing and sore throat.   Respiratory: Negative for cough, chest tightness, shortness of breath and wheezing.   Cardiovascular: Negative for chest pain and palpitations.  Gastrointestinal: Negative for abdominal pain, constipation, diarrhea, nausea and vomiting.  Endocrine: Negative for cold intolerance, heat intolerance, polydipsia and polyuria.  Musculoskeletal: Negative for arthralgias, back pain, joint swelling and neck pain.  Skin: Negative for rash.  Allergic/Immunologic: Negative for environmental allergies.  Neurological: Negative for dizziness,  tremors, numbness and headaches.  Hematological: Negative for adenopathy. Does not bruise/bleed easily.  Psychiatric/Behavioral: Negative for behavioral problems (Depression), sleep disturbance and suicidal ideas. The patient is not nervous/anxious.     Today's Vitals   04/29/20 0853  BP: 121/82  Pulse: 73  Resp: 16  Temp: (!) 97.5 F (36.4 C)  SpO2: 99%  Weight: 175 lb (79.4 kg)  Height: 4\' 11"  (1.499 m)   Body mass index is 35.35 kg/m.  Physical Exam Vitals and nursing note reviewed.  Constitutional:      General: She is not in acute distress.    Appearance: Normal appearance. She is well-developed.  She is not diaphoretic.  HENT:     Head: Normocephalic and atraumatic.     Nose: Nose normal.     Mouth/Throat:     Pharynx: No oropharyngeal exudate.  Eyes:     Extraocular Movements: Extraocular movements intact.     Pupils: Pupils are equal, round, and reactive to light.  Neck:     Thyroid: No thyromegaly.     Vascular: No JVD.     Trachea: No tracheal deviation.  Cardiovascular:     Rate and Rhythm: Normal rate and regular rhythm.     Heart sounds: Normal heart sounds. No murmur heard.  No friction rub. No gallop.   Pulmonary:     Effort: Pulmonary effort is normal. No respiratory distress.     Breath sounds: Normal breath sounds. No wheezing or rales.  Chest:     Chest wall: No tenderness.  Abdominal:     Palpations: Abdomen is soft.  Musculoskeletal:        General: Normal range of motion.     Cervical back: Normal range of motion and neck supple.     Comments: Significant scoliosis and kyphosis present. She is at her baseline.   Lymphadenopathy:     Cervical: No cervical adenopathy.  Skin:    General: Skin is warm and dry.  Neurological:     Mental Status: She is alert. She is disoriented.     Cranial Nerves: No cranial nerve deficit.  Psychiatric:        Mood and Affect: Mood normal.        Behavior: Behavior normal.        Thought Content: Thought  content normal.        Judgment: Judgment normal.    Assessment/Plan: 1. Essential hypertension Stable. Continue bp medication as prescribed   2. Chronic obstructive pulmonary disease, unspecified COPD type ([place]) Well managed. Continue to use inhalers as prescribed   3. BMI 35.0-35.9,adult May continue phentermine 37.5mg  tablets daily. She should limit calorie intake to 1200 calories per day and restart exercise program.  - phentermine (ADIPEX-P) 37.5 MG tablet; Take 1 tablet (37.5 mg total) by mouth daily before breakfast.  Dispense: 30 tablet; Refill: 1  General Counseling: Addalynne verbalizes understanding of the findings of todays visit and agrees with plan of treatment. I have discussed any further diagnostic evaluation that may be needed or ordered today. We also reviewed her medications today. she has been encouraged to call the office with any questions or concerns that should arise related to todays visit.   There is a liability release in patients' chart. There has been a 10 minute discussion about the side effects including but not limited to elevated blood pressure, anxiety, lack of sleep and dry mouth. Pt understands and will like to start/continue on appetite suppressant at this time. There will be one month RX given at the time of visit with proper follow up. Nova diet plan with restricted calories is given to the pt. Pt understands and agrees with  plan of treatment  This patient was seen by Leretha Pol FNP Collaboration with Dr Lavera Guise as a part of collaborative care agreement  Meds ordered this encounter  Medications   phentermine (ADIPEX-P) 37.5 MG tablet    Sig: Take 1 tablet (37.5 mg total) by mouth daily before breakfast.    Dispense:  30 tablet    Refill:  1    Written rx given to patient.    Order Specific Question:  Supervising Provider    Answer:   Lavera Guise [[id number]]    Total time spent: 25 Minutes   Time spent includes review of chart,  medications, test results, and follow up plan with the patient.      Dr Lavera Guise Internal medicine

## 2020-06-19 ENCOUNTER — Other Ambulatory Visit: Payer: Self-pay

## 2020-06-19 ENCOUNTER — Ambulatory Visit: Payer: Managed Care, Other (non HMO) | Admitting: Nurse Practitioner

## 2020-06-19 ENCOUNTER — Encounter: Payer: Self-pay | Admitting: Nurse Practitioner

## 2020-06-19 VITALS — BP 126/89 | HR 86 | Temp 97.6°F | Resp 16 | Ht 59.0 in | Wt 175.6 lb

## 2020-06-19 DIAGNOSIS — Z23 Encounter for immunization: Secondary | ICD-10-CM | POA: Diagnosis not present

## 2020-06-19 DIAGNOSIS — J449 Chronic obstructive pulmonary disease, unspecified: Secondary | ICD-10-CM

## 2020-06-19 DIAGNOSIS — Z6835 Body mass index (BMI) 35.0-35.9, adult: Secondary | ICD-10-CM

## 2020-06-19 DIAGNOSIS — I10 Essential (primary) hypertension: Secondary | ICD-10-CM

## 2020-06-19 NOTE — Progress Notes (Signed)
[facility] [place], [place] [address]  Internal MEDICINE  Office Visit Note   Patient Name: Patricia Le  [id number]  [id number]  Date of Service: 07/09/2020  Chief Complaint  Patient presents with  . Follow-up  . Weight Loss    management  . Gastroesophageal Reflux  . Hyperlipidemia  . Hypertension    The patient is here for follow up. She has been off and on phentermine to help with weight loss for several months. Weight is stable rather than showing loss. She admits to being off routine for last few months. She is limiting calorie intake to 1200 calories per day and she is gradually increasing the amount of physical activity she does every day. She has brought form with her from employer to get exemption on increased insurance premiums as her BMI is >29.9. Blood pressure is doing well. Her breathing has been much improved since she had surgery to repair large hiatal hernia.       Current Medication: Outpatient Encounter Medications as of 06/19/2020  Medication Sig Note  . albuterol (VENTOLIN HFA) 108 (90 Base) MCG/ACT inhaler Inhale 2 puffs into the lungs 4 (four) times daily as needed for wheezing or shortness of breath.   . budesonide-formoterol (SYMBICORT) 80-4.5 MCG/ACT inhaler Inhale 2 puffs into the lungs daily as needed.   . ergocalciferol (DRISDOL) 1.25 MG (50000 UT) capsule Take 1 capsule (50,000 Units total) by mouth once a week.   . fluticasone (FLONASE) 50 MCG/ACT nasal spray 2 SPRAYS IN BOTH NOSTRILS EVERY DAY FOR RHINITIS   . gabapentin (NEURONTIN) 300 MG capsule Take 1 capsule (300 mg total) by mouth 3 (three) times daily.   . meloxicam (MOBIC) 7.5 MG tablet Take 1 tablet (7.5 mg total) by mouth as needed for pain.   . Tiotropium Bromide Monohydrate (SPIRIVA RESPIMAT) 2.5 MCG/ACT AERS Inhale 2 puffs into the lungs daily at 6 (six) AM.   . triamcinolone cream (KENALOG) 0.1 % Apply to all affected areas twice daily as needed   .  [DISCONTINUED] metoprolol tartrate (LOPRESSOR) 50 MG tablet TAKE 1 TABLET BY MOUTH  TWICE DAILY   . [DISCONTINUED] phentermine (ADIPEX-P) 37.5 MG tablet Take 1 tablet (37.5 mg total) by mouth daily before breakfast. 06/19/2020: trial of saxenda   No facility-administered encounter medications on file as of 06/19/2020.    Surgical History: Past Surgical History:  Procedure Laterality Date  . ABDOMINAL HYSTERECTOMY    . BACK SURGERY    . BREAST BIOPSY Right 2015   DCIS  . BREAST LUMPECTOMY Right 2015   DCIS with Mammosite  . BREAST MAMMOSITE Right 07-02-14  . BREAST SURGERY Right 06-06-14   Wide excision of Right breast DCIS  . CESAREAN SECTION    . CHOLECYSTECTOMY    . COLONOSCOPY     over 10 yrs ago/ Dr Kristopher Glee  . COLONOSCOPY  11-20-14   Dr Jamal Collin hyperplastic polyp  . TONSILLECTOMY    . TUBAL LIGATION      Medical History: Past Medical History:  Diagnosis Date  . Anemia   . 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 Cancer [facility])    DCIS right breast  . Colon polyp   . GERD (gastroesophageal reflux disease)   . Hiatal hernia   . Hyperlipidemia   . Hypertension   . Personal history of radiation therapy   . Restless leg syndrome     Family History: Family History  Problem Relation Age of Onset  . Diabetes Mother   . Cancer Father 87       melanoma  . Breast cancer Neg Hx     Social History   Socioeconomic History  . Marital status: Divorced    Spouse name: Not on file  . Number of children: Not on file  . Years of education: Not on file  . Highest education level: Not on file  Occupational History  . Not on file  Tobacco Use  . Smoking status: Former Smoker    Packs/day: 1.00    Years: 20.00    Pack years: 20.00    Types: Cigarettes  . Smokeless tobacco: Never Used  Substance and Sexual Activity  . Alcohol use: Yes    Alcohol/week: 0.0 standard drinks    Comment: occasionally  . Drug use: Never  . Sexual  activity: Not on file  Other Topics Concern  . Not on file  Social History Narrative  . Not on file   Social Determinants of Health   Financial Resource Strain:   . Difficulty of Paying Living Expenses: Not on file  Food Insecurity:   . Worried About Charity fundraiser in the Last Year: Not on file  . Ran Out of Food in the Last Year: Not on file  Transportation Needs:   . Lack of Transportation (Medical): Not on file  . Lack of Transportation (Non-Medical): Not on file  Physical Activity:   . Days of Exercise per Week: Not on file  . Minutes of Exercise per Session: Not on file  Stress:   . Feeling of Stress : Not on file  Social Connections:   . Frequency of Communication with Friends and Family: Not on file  . Frequency of Social Gatherings with Friends and Family: Not on file  . Attends Religious Services: Not on file  . Active Member of Clubs or Organizations: Not on file  . Attends Archivist Meetings: Not on file  . Marital Status: Not on file  Intimate Partner Violence:   . Fear of Current or Ex-Partner: Not on file  . Emotionally Abused: Not on file  . Physically Abused: Not on file  . Sexually Abused: Not on file      Review of Systems  Constitutional: Negative for chills, fatigue and unexpected weight change.  HENT: Negative for congestion, postnasal drip, rhinorrhea, sneezing and sore throat.   Respiratory: Negative for cough, chest tightness, shortness of breath and wheezing.   Cardiovascular: Negative for chest pain and palpitations.  Gastrointestinal: Negative for abdominal pain, constipation, diarrhea, nausea and vomiting.  Endocrine: Negative for cold intolerance, heat intolerance, polydipsia and polyuria.  Musculoskeletal: Negative for arthralgias, back pain, joint swelling and neck pain.  Skin: Negative for rash.  Allergic/Immunologic: Negative for environmental allergies.  Neurological: Negative for dizziness, tremors, numbness and  headaches.  Hematological: Negative for adenopathy. Does not bruise/bleed easily.  Psychiatric/Behavioral: Negative for behavioral problems (Depression), sleep disturbance and suicidal ideas. The patient is not nervous/anxious.    Today's Vitals   06/19/20 1600  BP: 126/89  Pulse: 86  Resp: 16  Temp: 97.6 F (36.4 C)  SpO2: 93%  Weight: 175 lb 9.6 oz (79.7 kg)  Height: 4\' 11"  (1.499 m)   Body mass index is 35.47 kg/m.  Physical Exam Vitals and nursing note reviewed.  Constitutional:      General: She is not in acute distress.    Appearance: Normal appearance. She is well-developed. She is  not diaphoretic.  HENT:     Head: Normocephalic and atraumatic.     Nose: Nose normal.     Mouth/Throat:     Pharynx: No oropharyngeal exudate.  Eyes:     Extraocular Movements: Extraocular movements intact.     Pupils: Pupils are equal, round, and reactive to light.  Neck:     Thyroid: No thyromegaly.     Vascular: No JVD.     Trachea: No tracheal deviation.  Cardiovascular:     Rate and Rhythm: Normal rate and regular rhythm.     Heart sounds: Normal heart sounds. No murmur heard.  No friction rub. No gallop.   Pulmonary:     Effort: Pulmonary effort is normal. No respiratory distress.     Breath sounds: Normal breath sounds. No wheezing or rales.  Chest:     Chest wall: No tenderness.  Abdominal:     Palpations: Abdomen is soft.  Musculoskeletal:        General: Normal range of motion.     Cervical back: Normal range of motion and neck supple.     Comments: Significant scoliosis and kyphosis present. She is at her baseline.   Lymphadenopathy:     Cervical: No cervical adenopathy.  Skin:    General: Skin is warm and dry.  Neurological:     Mental Status: She is alert. She is disoriented.     Cranial Nerves: No cranial nerve deficit.  Psychiatric:        Mood and Affect: Mood normal.        Behavior: Behavior normal.        Thought Content: Thought content normal.         Judgment: Judgment normal.   Assessment/Plan: 1. Essential hypertension Stable. Continue bp medication as prescribed   2. Chronic obstructive pulmonary disease, unspecified COPD type ([place]) Well managed. Continue inhalers as prescribed   3. BMI 35.0-35.9,adult Taper off use of phentermine. Advised she continue to limit calorie intake to 1200 calories per day and continue to incorporate exercise into daily routine as tolerated. Exemption form to be completed and returned to patient when ready.   4. Flu vaccine need Flu vaccine administered today.  - Flu Vaccine MDCK QUAD PF  General Counseling: Nailea verbalizes understanding of the findings of todays visit and agrees with plan of treatment. I have discussed any further diagnostic evaluation that may be needed or ordered today. We also reviewed her medications today. she has been encouraged to call the office with any questions or concerns that should arise related to todays visit.   Obesity Counseling: Risk Assessment: An assessment of behavioral risk factors was made today and includes lack of exercise sedentary lifestyle, lack of portion control and poor dietary habits.  Risk Modification Advice: She was counseled on portion control guidelines. Restricting daily caloric intake to 1200 calories per day . The detrimental long term effects of obesity on her health and ongoing poor compliance was also discussed with the patient.  This patient was seen by Leretha Pol FNP Collaboration with Dr Lavera Guise as a part of collaborative care agreement    Orders Placed This Encounter  Procedures  . Flu Vaccine MDCK QUAD PF     Total time spent: 30 Minutes   Time spent includes review of chart, medications, test results, and follow up plan with the patient.      Dr Lavera Guise Internal medicine

## 2020-06-24 ENCOUNTER — Telehealth: Payer: Self-pay

## 2020-06-24 NOTE — Telephone Encounter (Signed)
Yes. We just discussed this 9/30 and I have not completed the note. Do I just need to send prescription to [facility]? Or can she get this from her local pharmacy?

## 2020-06-24 NOTE — Telephone Encounter (Signed)
Pt was approved thru [place] for Saxenda inj 18mg /18ml for 6 months thru 12/22/2020.  dbs

## 2020-06-25 NOTE — Telephone Encounter (Signed)
Spoke to pt and she asked for it to be sent to [facility].  dbs

## 2020-06-25 NOTE — Telephone Encounter (Signed)
Left VM for pt to return call.  dbs

## 2020-06-26 ENCOUNTER — Other Ambulatory Visit: Payer: Self-pay

## 2020-06-26 DIAGNOSIS — I10 Essential (primary) hypertension: Secondary | ICD-10-CM

## 2020-06-26 MED ORDER — METOPROLOL TARTRATE 50 MG PO TABS: 50.0000 mg | ORAL_TABLET | Freq: Two times a day (BID) | ORAL | 1 refills | Status: DC

## 2020-07-09 ENCOUNTER — Telehealth: Payer: Self-pay

## 2020-07-09 DIAGNOSIS — Z23 Encounter for immunization: Secondary | ICD-10-CM | POA: Insufficient documentation

## 2020-07-09 NOTE — Telephone Encounter (Signed)
BMI exemption form completed and ready for pickup at front desk. Patient advised.

## 2020-07-16 ENCOUNTER — Other Ambulatory Visit: Payer: Self-pay

## 2020-07-16 MED ORDER — GABAPENTIN 300 MG PO CAPS: 300.0000 mg | ORAL_CAPSULE | Freq: Three times a day (TID) | ORAL | 1 refills | Status: DC

## 2020-07-17 ENCOUNTER — Ambulatory Visit: Payer: Managed Care, Other (non HMO) | Admitting: Nurse Practitioner

## 2020-07-21 ENCOUNTER — Other Ambulatory Visit: Payer: Self-pay

## 2020-07-21 ENCOUNTER — Encounter: Payer: Self-pay | Admitting: Nurse Practitioner

## 2020-07-21 ENCOUNTER — Ambulatory Visit (INDEPENDENT_AMBULATORY_CARE_PROVIDER_SITE_OTHER): Payer: Managed Care, Other (non HMO) | Admitting: Nurse Practitioner

## 2020-07-21 VITALS — BP 106/76 | HR 75 | Temp 97.4°F | Resp 16 | Ht 59.0 in | Wt 175.0 lb

## 2020-07-21 DIAGNOSIS — Z1231 Encounter for screening mammogram for malignant neoplasm of breast: Secondary | ICD-10-CM

## 2020-07-21 DIAGNOSIS — Z0001 Encounter for general adult medical examination with abnormal findings: Secondary | ICD-10-CM

## 2020-07-21 DIAGNOSIS — Z6835 Body mass index (BMI) 35.0-35.9, adult: Secondary | ICD-10-CM

## 2020-07-21 DIAGNOSIS — J449 Chronic obstructive pulmonary disease, unspecified: Secondary | ICD-10-CM | POA: Diagnosis not present

## 2020-07-21 DIAGNOSIS — Z124 Encounter for screening for malignant neoplasm of cervix: Secondary | ICD-10-CM

## 2020-07-21 DIAGNOSIS — D0511 Intraductal carcinoma in situ of right breast: Secondary | ICD-10-CM

## 2020-07-21 DIAGNOSIS — I10 Essential (primary) hypertension: Secondary | ICD-10-CM

## 2020-07-21 DIAGNOSIS — Z13820 Encounter for screening for osteoporosis: Secondary | ICD-10-CM

## 2020-07-21 DIAGNOSIS — R300 Dysuria: Secondary | ICD-10-CM

## 2020-07-21 NOTE — Progress Notes (Signed)
[facility] [place], [place] [address]  Internal MEDICINE  Office Visit Note  Patient Name: Patricia Le  [id number]  [id number]  Date of Service: 08/10/2020   Pt is here for routine health maintenance examination   Chief Complaint  Patient presents with  . Annual Exam    diarrhea for the last 3 days but better today  . Hypertension  . Hyperlipidemia  . Anemia  . policy update form    received     The patient is here for health maintenance exam and pap smear. She was started on samples of saxenda at her most recent visit. She did increase the dose and developed severe diarrhea. Was unsure if this was episode of IBS with diarrhea or if it was medication related. She has held dosing for the past week. She plans to restart the medication again tomorrow. Will restart lowest dose tomorrow. Her blood pressure is well controlled. 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 She is due to have screening mammogram. She is six years cancer free. She should also have bone density test. Had labs done through biometric screening at work. Will bring copy to have scanned into her chart .   Current Medication: Outpatient Encounter Medications as of 07/21/2020  Medication Sig  . albuterol (VENTOLIN HFA) 108 (90 Base) MCG/ACT inhaler Inhale 2 puffs into the lungs 4 (four) times daily as needed for wheezing or shortness of breath.  . budesonide-formoterol (SYMBICORT) 80-4.5 MCG/ACT inhaler Inhale 2 puffs into the lungs daily as needed.  . fluticasone (FLONASE) 50 MCG/ACT nasal spray 2 SPRAYS IN BOTH NOSTRILS EVERY DAY FOR RHINITIS  . gabapentin (NEURONTIN) 300 MG capsule Take 1 capsule (300 mg total) by mouth 3 (three) times daily.  . meloxicam (MOBIC) 7.5 MG tablet Take 1 tablet (7.5 mg total) by mouth as needed for pain.  . metoprolol tartrate (LOPRESSOR) 50 MG tablet Take 1 tablet (50 mg total) by mouth 2 (two) times daily.  . Tiotropium Bromide  Monohydrate (SPIRIVA RESPIMAT) 2.5 MCG/ACT AERS Inhale 2 puffs into the lungs daily at 6 (six) AM.  . triamcinolone cream (KENALOG) 0.1 % Apply to all affected areas twice daily as needed  . [DISCONTINUED] ergocalciferol (DRISDOL) 1.25 MG (50000 UT) capsule Take 1 capsule (50,000 Units total) by mouth once a week.   No facility-administered encounter medications on file as of 07/21/2020.    Surgical History: Past Surgical History:  Procedure Laterality Date  . ABDOMINAL HYSTERECTOMY    . BACK SURGERY    . BREAST BIOPSY Right 2015   DCIS  . BREAST LUMPECTOMY Right 2015   DCIS with Mammosite  . BREAST MAMMOSITE Right 07-02-14  . BREAST SURGERY Right 06-06-14   Wide excision of Right breast DCIS  . CESAREAN SECTION    . CHOLECYSTECTOMY    . COLONOSCOPY     over 10 yrs ago/ Dr Kristopher Glee  . COLONOSCOPY  11-20-14   Dr Jamal Collin hyperplastic polyp  . HIATAL HERNIA REPAIR  04/2019  . TONSILLECTOMY    . TUBAL LIGATION      Medical History: Past Medical History:  Diagnosis Date  . Anemia   . Breast cancer ([place]) 2015   9 mm intermediate grade DCIS; ER: 90%, PR: 1-15%  . Cancer [facility])    DCIS right breast  . Colon polyp   . GERD (gastroesophageal reflux disease)   . Hiatal hernia   . Hyperlipidemia   . Hypertension   . Personal  history of radiation therapy   . Restless leg syndrome     Family History: Family History  Problem Relation Age of Onset  . Diabetes Mother   . Cancer Father 59       melanoma  . Breast cancer Neg Hx       Review of Systems  Constitutional: Negative for activity change, chills, fatigue and unexpected weight change.  HENT: Negative for congestion, postnasal drip, rhinorrhea, sneezing and sore throat.   Respiratory: Negative for cough, chest tightness, shortness of breath and wheezing.   Cardiovascular: Negative for chest pain and palpitations.  Gastrointestinal: Negative for abdominal pain, constipation, diarrhea, nausea and vomiting.  Endocrine:  Negative for cold intolerance, heat intolerance, polydipsia and polyuria.  Genitourinary: Negative for dysuria, frequency, hematuria and urgency.  Musculoskeletal: Negative for arthralgias, back pain, joint swelling and neck pain.  Skin: Negative for rash.  Allergic/Immunologic: Positive for environmental allergies.  Neurological: Negative for dizziness, tremors, numbness and headaches.  Hematological: Negative for adenopathy. Does not bruise/bleed easily.  Psychiatric/Behavioral: Negative for behavioral problems (Depression), sleep disturbance and suicidal ideas. The patient is not nervous/anxious.      Today's Vitals   07/21/20 1455  BP: 106/76  Pulse: 75  Resp: 16  Temp: (!) 97.4 F (36.3 C)  SpO2: 99%  Weight: 175 lb (79.4 kg)  Height: 4\' 11"  (1.499 m)   Body mass index is 35.35 kg/m.  Physical Exam Vitals and nursing note reviewed.  Constitutional:      General: She is not in acute distress.    Appearance: Normal appearance. She is well-developed. She is obese. She is not diaphoretic.  HENT:     Head: Normocephalic and atraumatic.     Nose: Nose normal.     Mouth/Throat:     Pharynx: No oropharyngeal exudate.  Eyes:     Pupils: Pupils are equal, round, and reactive to light.  Neck:     Thyroid: No thyromegaly.     Vascular: No carotid bruit or JVD.     Trachea: No tracheal deviation.  Cardiovascular:     Rate and Rhythm: Normal rate and regular rhythm.     Pulses: Normal pulses.     Heart sounds: Normal heart sounds. No murmur heard.  No friction rub. No gallop.   Pulmonary:     Effort: Pulmonary effort is normal. No respiratory distress.     Breath sounds: Normal breath sounds. No wheezing or rales.  Chest:     Chest wall: No tenderness.     Breasts:        Right: Normal. No swelling, bleeding, inverted nipple, mass, nipple discharge, skin change or tenderness.        Left: Normal. No swelling, bleeding, inverted nipple, mass, nipple discharge, skin change  or tenderness.  Abdominal:     General: Bowel sounds are normal.     Palpations: Abdomen is soft.     Tenderness: There is no abdominal tenderness.     Hernia: There is no hernia in the left inguinal area or right inguinal area.  Genitourinary:    General: Normal vulva.     Exam position: Supine.     Labia:        Right: No tenderness or lesion.        Left: No tenderness or lesion.      Vagina: No vaginal discharge, erythema, tenderness or bleeding.     Cervix: No friability, lesion or erythema.     Uterus: Normal.  Adnexa: Right adnexa normal.     Comments: No tenderness, masses, or organomeglay present during bimanual exam . Musculoskeletal:        General: Normal range of motion.     Cervical back: Normal range of motion and neck supple.     Comments: Moderate/severe scoliosis present.   Lymphadenopathy:     Cervical: No cervical adenopathy.     Upper Body:     Right upper body: No axillary adenopathy.     Left upper body: No axillary adenopathy.     Lower Body: No right inguinal adenopathy. No left inguinal adenopathy.  Skin:    General: Skin is warm and dry.  Neurological:     General: No focal deficit present.     Mental Status: She is alert and oriented to person, place, and time.     Cranial Nerves: No cranial nerve deficit.  Psychiatric:        Mood and Affect: Mood normal.        Behavior: Behavior normal.        Thought Content: Thought content normal.        Judgment: Judgment normal.      LABS: Recent Results (from the past 2160 hour(s))  UA/M w/rflx Culture, Routine     Status: Abnormal   Collection Time: 07/21/20  3:00 PM   Specimen: Urine   Urine  Result Value Ref Range   Specific Gravity, UA 1.028 1.005 - 1.030   pH, UA 6.0 5.0 - 7.5   Color, UA Yellow Yellow   Appearance Ur Turbid (A) Clear   Leukocytes,UA Negative Negative   Protein,UA Trace Negative/Trace   Glucose, UA Negative Negative   Ketones, UA Trace (A) Negative   RBC, UA  Negative Negative   Bilirubin, UA Negative Negative   Urobilinogen, Ur 1.0 0.2 - 1.0 mg/dL   Nitrite, UA Negative Negative   Microscopic Examination Comment     Comment: Microscopic follows if indicated.   Microscopic Examination See below:     Comment: Microscopic was indicated and was performed.   Urinalysis Reflex Comment     Comment: This specimen will not reflex to a Urine Culture.  IGP, Aptima HPV     Status: None   Collection Time: 07/21/20  3:00 PM  Result Value Ref Range   Interpretation NILM,QC     Comment: NEGATIVE FOR INTRAEPITHELIAL LESION OR MALIGNANCY. THIS SPECIMEN WAS RESCREENED AS PART OF OUR QUALITY CONTROL PROGRAM.    Category NIL     Comment: Negative for Intraepithelial Lesion   Adequacy SPHHR     Comment: Satisfactory for evaluation. No endocervical cells are present. This is consistent with a history of hysterectomy.    Clinician Provided ICD10 Comment     Comment: Z12.4   Performed by: Comment     Comment: Letta Median, Cytotechnologist (ASCP)   QC reviewed by: Comment     Comment: Migdalia Dk, Supervisory Cytotechnologist (ASCP)   Note: Comment     Comment: The Pap smear is a screening test designed to aid in the detection of premalignant and malignant conditions of the uterine cervix.  It is not a diagnostic procedure and should not be used as the sole means of detecting cervical cancer.  Both false-positive and false-negative reports do occur.    Test Methodology Comment     Comment: This liquid based ThinPrep(R) pap test was screened with the use of an image guided system.    HPV Aptima Negative Negative  Comment: This nucleic acid amplification test detects fourteen high-risk HPV types (16,18,31,33,35,39,45,51,52,56,58,59,66,68) without differentiation.   Microscopic Examination     Status: Abnormal   Collection Time: 07/21/20  3:00 PM   Urine  Result Value Ref Range   WBC, UA 0-5 0 - 5 /hpf   RBC None seen 0 - 2 /hpf   Epithelial  Cells (non renal) >10 (A) 0 - 10 /hpf   Casts None seen None seen /lpf   Crystals Present (A) N/A   Crystal Type Amorphous Sediment N/A    Comment: Calcium Oxalate   Bacteria, UA Few None seen/Few    Assessment/Plan: 1. Encounter for general adult medical examination with abnormal findings Annual health maintenance exam today with pap smear today.   2. Essential hypertension Stable. Continue bp medications as prescribed   3. Chronic obstructive pulmonary disease, unspecified COPD type ([place]) Well managed. Continue to use inhalers as prescribed   4. BMI 35.0-35.9,adult Restart saxenda and reviewed instructions for use. Recommend she limit calorie intake to 1200 calories per day and incorporate exercise into her daily routine.   5. Routine cervical smear - IGP, Aptima HPV  6. Encounter for screening mammogram for malignant neoplasm of breast - MM DIGITAL SCREENING BILATERAL; Future  7. Ductal carcinoma in situ (DCIS) of right breast - MM DIGITAL SCREENING BILATERAL; Future  8. Screening for osteoporosis Bone density test ordered today.  - DG Bone Density; Future  9. Dysuria - UA/M w/rflx Culture, Routine  General Counseling: Tomasina verbalizes understanding of the findings of todays visit and agrees with plan of treatment. I have discussed any further diagnostic evaluation that may be needed or ordered today. We also reviewed her medications today. she has been encouraged to call the office with any questions or concerns that should arise related to todays visit.    Counseling:  Obesity Counseling: Risk Assessment: An assessment of behavioral risk factors was made today and includes lack of exercise sedentary lifestyle, lack of portion control and poor dietary habits.  Risk Modification Advice: She was counseled on portion control guidelines. Restricting daily caloric intake to 1200 calories per day. The detrimental long term effects of obesity on her health and ongoing poor  compliance was also discussed with the patient.  This patient was seen by Leretha Pol FNP Collaboration with Dr Lavera Guise as a part of collaborative care agreement  Orders Placed This Encounter  Procedures  . Microscopic Examination  . MM DIGITAL SCREENING BILATERAL  . DG Bone Density  . UA/M w/rflx Culture, Routine     Total time spent: 60 Minutes  Time spent includes review of chart, medications, test results, and follow up plan with the patient.     Lavera Guise, MD  Internal Medicine

## 2020-07-22 ENCOUNTER — Other Ambulatory Visit: Payer: Self-pay | Admitting: Nurse Practitioner

## 2020-07-22 DIAGNOSIS — Z1231 Encounter for screening mammogram for malignant neoplasm of breast: Secondary | ICD-10-CM

## 2020-07-22 LAB — MICROSCOPIC EXAMINATION
Casts: NONE SEEN /lpf
Epithelial Cells (non renal): >10 /hpf — AB (ref 0–10)
RBC, Urine: NONE SEEN /hpf (ref 0–2)

## 2020-07-22 LAB — UA/M W/RFLX CULTURE, ROUTINE
Bilirubin, UA: NEGATIVE
Glucose, UA: NEGATIVE
Leukocytes,UA: NEGATIVE
Nitrite, UA: NEGATIVE
RBC, UA: NEGATIVE
Specific Gravity, UA: 1.028 (ref 1.005–1.030)
Urobilinogen, Ur: 1 mg/dL (ref 0.2–1.0)
pH, UA: 6 (ref 5.0–7.5)

## 2020-07-24 LAB — IGP, APTIMA HPV: HPV Aptima: NEGATIVE

## 2020-07-24 NOTE — Progress Notes (Signed)
Please let the patient know that her pap smear was normal. Thanks.

## 2020-07-25 ENCOUNTER — Telehealth: Payer: Self-pay

## 2020-07-25 NOTE — Telephone Encounter (Signed)
Pt informed of pap results.  

## 2020-08-10 DIAGNOSIS — Z1231 Encounter for screening mammogram for malignant neoplasm of breast: Secondary | ICD-10-CM | POA: Insufficient documentation

## 2020-08-10 DIAGNOSIS — Z13820 Encounter for screening for osteoporosis: Secondary | ICD-10-CM | POA: Insufficient documentation

## 2020-08-10 MED ORDER — SAXENDA 18 MG/3ML ~~LOC~~ SOPN: 3.0000 mg | PEN_INJECTOR | Freq: Every day | SUBCUTANEOUS | 1 refills | Status: DC

## 2020-08-19 ENCOUNTER — Other Ambulatory Visit: Payer: Self-pay | Admitting: Nurse Practitioner

## 2020-08-19 ENCOUNTER — Ambulatory Visit
Admission: RE | Admit: 2020-08-19 | Discharge: 2020-08-19 | Disposition: A | Discharge status: Home / Self Care | Payer: Managed Care, Other (non HMO) | Source: Ambulatory Visit | Attending: Nurse Practitioner | Admitting: Nurse Practitioner

## 2020-08-19 ENCOUNTER — Other Ambulatory Visit: Payer: Self-pay

## 2020-08-19 DIAGNOSIS — Z13820 Encounter for screening for osteoporosis: Secondary | ICD-10-CM | POA: Diagnosis not present

## 2020-08-19 DIAGNOSIS — Z6835 Body mass index (BMI) 35.0-35.9, adult: Secondary | ICD-10-CM

## 2020-08-19 DIAGNOSIS — Z1231 Encounter for screening mammogram for malignant neoplasm of breast: Secondary | ICD-10-CM

## 2020-08-19 MED ORDER — SAXENDA 18 MG/3ML ~~LOC~~ SOPN: 3.0000 mg | PEN_INJECTOR | Freq: Every day | SUBCUTANEOUS | 1 refills | Status: DC

## 2020-08-19 NOTE — Telephone Encounter (Signed)
I think I alread ytook care of this, but resent it to [facility] in case I did not .

## 2020-08-20 NOTE — Progress Notes (Signed)
Normal bone density of femur. Osteopenia in wrist. Discuss at next visit

## 2020-08-20 NOTE — Progress Notes (Signed)
Negative mammogram

## 2020-10-21 ENCOUNTER — Ambulatory Visit: Payer: Managed Care, Other (non HMO) | Admitting: Hospice and Palliative Medicine

## 2020-10-21 ENCOUNTER — Encounter: Payer: Self-pay | Admitting: Hospice and Palliative Medicine

## 2020-10-21 VITALS — BP 127/87 | HR 90 | Temp 97.6°F | Resp 16 | Ht 59.0 in | Wt 171.0 lb

## 2020-10-21 DIAGNOSIS — Z6834 Body mass index (BMI) 34.0-34.9, adult: Secondary | ICD-10-CM | POA: Diagnosis not present

## 2020-10-21 DIAGNOSIS — Z0001 Encounter for general adult medical examination with abnormal findings: Secondary | ICD-10-CM | POA: Diagnosis not present

## 2020-10-21 DIAGNOSIS — M85832 Other specified disorders of bone density and structure, left forearm: Secondary | ICD-10-CM

## 2020-10-21 DIAGNOSIS — I10 Essential (primary) hypertension: Secondary | ICD-10-CM

## 2020-10-21 DIAGNOSIS — R5383 Other fatigue: Secondary | ICD-10-CM

## 2020-10-21 NOTE — Progress Notes (Signed)
[facility] [place], [place] [address]  Internal MEDICINE  Office Visit Note  Patient Name: Patricia Le  [id number]  [id number]  Date of Service: 10/25/2020  Chief Complaint  Patient presents with  . Follow-up  . Hyperlipidemia  . Hypertension  . Gastroesophageal Reflux    HPI Patient is here for routine follow-up Overall, things have been going well Has restarted Saxenda for weight loss--tolerating well without episodes of diarrhea, history of IBS-D Recently had mammogram--normal, history of breast cancer 6 years ago BMD scan--osteopenia, not currently taking calcium or vitamin d supplements  Needs updated fasting labs  Current Medication: Outpatient Encounter Medications as of 10/21/2020  Medication Sig  . albuterol (VENTOLIN HFA) 108 (90 Base) MCG/ACT inhaler Inhale 2 puffs into the lungs 4 (four) times daily as needed for wheezing or shortness of breath.  . budesonide-formoterol (SYMBICORT) 80-4.5 MCG/ACT inhaler Inhale 2 puffs into the lungs daily as needed.  . fluticasone (FLONASE) 50 MCG/ACT nasal spray 2 SPRAYS IN BOTH NOSTRILS EVERY DAY FOR RHINITIS  . gabapentin (NEURONTIN) 300 MG capsule Take 1 capsule (300 mg total) by mouth 3 (three) times daily.  . Liraglutide -Weight Management (SAXENDA) 18 MG/3ML SOPN Inject 3 mg into the skin daily.  . meloxicam (MOBIC) 7.5 MG tablet Take 1 tablet (7.5 mg total) by mouth as needed for pain.  . metoprolol tartrate (LOPRESSOR) 50 MG tablet Take 1 tablet (50 mg total) by mouth 2 (two) times daily.  . Tiotropium Bromide Monohydrate (SPIRIVA RESPIMAT) 2.5 MCG/ACT AERS Inhale 2 puffs into the lungs daily at 6 (six) AM.  . triamcinolone cream (KENALOG) 0.1 % Apply to all affected areas twice daily as needed   No facility-administered encounter medications on file as of 10/21/2020.    Surgical History: Past Surgical History:  Procedure Laterality Date  . ABDOMINAL HYSTERECTOMY    . BACK SURGERY    .  BREAST BIOPSY Right 2015   DCIS  . BREAST LUMPECTOMY Right 2015   DCIS with Mammosite  . BREAST MAMMOSITE Right 07-02-14  . BREAST SURGERY Right 06-06-14   Wide excision of Right breast DCIS  . CESAREAN SECTION    . CHOLECYSTECTOMY    . COLONOSCOPY     over 10 yrs ago/ Dr Kristopher Glee  . COLONOSCOPY  11-20-14   Dr Jamal Collin hyperplastic polyp  . HIATAL HERNIA REPAIR  04/2019  . TONSILLECTOMY    . TUBAL LIGATION      Medical History: Past Medical History:  Diagnosis Date  . Anemia   . Breast cancer ([place]) 2015   9 mm intermediate grade DCIS; ER: 90%, PR: 1-15%  . Cancer [facility])    DCIS right breast  . Colon polyp   . GERD (gastroesophageal reflux disease)   . Hiatal hernia   . Hyperlipidemia   . Hypertension   . Personal history of radiation therapy   . Restless leg syndrome     Family History: Family History  Problem Relation Age of Onset  . Diabetes Mother   . Cancer Father 72       melanoma  . Breast cancer Neg Hx     Social History   Socioeconomic History  . Marital status: Divorced    Spouse name: Not on file  . Number of children: Not on file  . Years of education: Not on file  . Highest education level: Not on file  Occupational History  . Not on file  Tobacco Use  . Smoking status: Former  Smoker    Packs/day: 1.00    Years: 20.00    Pack years: 20.00    Types: Cigarettes  . Smokeless tobacco: Never Used  Substance and Sexual Activity  . Alcohol use: Yes    Alcohol/week: 0.0 standard drinks    Comment: occasionally  . Drug use: Never  . Sexual activity: Not on file  Other Topics Concern  . Not on file  Social History Narrative  . Not on file   Social Determinants of Health   Financial Resource Strain: Not on file  Food Insecurity: Not on file  Transportation Needs: Not on file  Physical Activity: Not on file  Stress: Not on file  Social Connections: Not on file  Intimate Partner Violence: Not on file      Review of Systems  Constitutional:  Negative for chills, diaphoresis and fatigue.  HENT: Negative for ear pain, postnasal drip and sinus pressure.   Eyes: Negative for photophobia, discharge, redness, itching and visual disturbance.  Respiratory: Negative for cough, shortness of breath and wheezing.   Cardiovascular: Negative for chest pain, palpitations and leg swelling.  Gastrointestinal: Negative for abdominal pain, constipation, diarrhea, nausea and vomiting.  Genitourinary: Negative for dysuria and flank pain.  Musculoskeletal: Negative for arthralgias, back pain, gait problem and neck pain.  Skin: Negative for color change.  Allergic/Immunologic: Negative for environmental allergies and food allergies.  Neurological: Negative for dizziness and headaches.  Hematological: Does not bruise/bleed easily.  Psychiatric/Behavioral: Negative for agitation, behavioral problems (depression) and hallucinations.    Vital Signs: BP 127/87   Pulse 90   Temp 97.6 F (36.4 C)   Resp 16   Ht 4\' 11"  (1.499 m)   Wt 171 lb (77.6 kg)   SpO2 97%   BMI 34.54 kg/m    Physical Exam Vitals reviewed.  Constitutional:      Appearance: Normal appearance. She is obese.  Cardiovascular:     Rate and Rhythm: Normal rate and regular rhythm.     Pulses: Normal pulses.     Heart sounds: Normal heart sounds.  Pulmonary:     Effort: Pulmonary effort is normal.     Breath sounds: Normal breath sounds.  Abdominal:     General: Abdomen is flat.  Musculoskeletal:        General: Normal range of motion.     Cervical back: Normal range of motion.  Skin:    General: Skin is warm.  Neurological:     General: No focal deficit present.     Mental Status: She is alert and oriented to person, place, and time. Mental status is at baseline.  Psychiatric:        Mood and Affect: Mood normal.        Behavior: Behavior normal.        Thought Content: Thought content normal.        Judgment: Judgment normal.    Assessment/Plan: 1. Osteopenia of  left forearm Advised to start OTC vitamin D with calcium  2. BMI 34.0-34.9,adult Continue with Saxenda for weight loss Discussed intermittent fasting, calorie counting and low intensity exercise Obesity Counseling: Risk Assessment: An assessment of behavioral risk factors was made today and includes lack of exercise sedentary lifestyle, lack of portion control and poor dietary habits.  Risk Modification Advice: She was counseled on portion control guidelines. Restricting daily caloric intake to 1800. The detrimental long term effects of obesity on her health and ongoing poor compliance was also discussed with the patient. - TSH +  free T4  3. Essential hypertension BP and HR remain well controlled on current therapy, continue monitoring - Comprehensive Metabolic Panel (CMET)  4. Encounter for routine adult health examination with abnormal findings - CBC w/Diff/Platelet - Comprehensive Metabolic Panel (CMET) - Lipid Panel With LDL/HDL Ratio - TSH + free T4 - B12  5. Other fatigue - CBC w/Diff/Platelet - Comprehensive Metabolic Panel (CMET) - Lipid Panel With LDL/HDL Ratio - TSH + free T4 - B12  General Counseling: Renley verbalizes understanding of the findings of todays visit and agrees with plan of treatment. I have discussed any further diagnostic evaluation that may be needed or ordered today. We also reviewed her medications today. she has been encouraged to call the office with any questions or concerns that should arise related to todays visit.    Orders Placed This Encounter  Procedures  . CBC w/Diff/Platelet  . Comprehensive Metabolic Panel (CMET)  . Lipid Panel With LDL/HDL Ratio  . TSH + free T4  . B12      Time spent: 30 Minutes Time spent includes review of chart, medications, test results and follow-up plan with the patient.  This patient was seen by Theodoro Grist AGNP-C in Collaboration with Dr Lavera Guise as a part of collaborative care agreement      Tanna Furry. Trinity Hyland AGNP-C Internal medicine

## 2020-10-25 ENCOUNTER — Encounter: Payer: Self-pay | Admitting: Hospice and Palliative Medicine

## 2020-12-03 ENCOUNTER — Telehealth: Payer: Self-pay

## 2020-12-03 NOTE — Telephone Encounter (Signed)
[id number]. SAXENDA INJ 18MG /3ML is approved through 06/05/2021. Pt aware.

## 2021-01-20 ENCOUNTER — Other Ambulatory Visit: Payer: Self-pay

## 2021-01-20 ENCOUNTER — Ambulatory Visit: Payer: Managed Care, Other (non HMO) | Admitting: Hospice and Palliative Medicine

## 2021-01-20 ENCOUNTER — Encounter: Payer: Self-pay | Admitting: Hospice and Palliative Medicine

## 2021-01-20 VITALS — BP 122/86 | HR 100 | Temp 97.3°F | Resp 16 | Ht 59.0 in | Wt 181.6 lb

## 2021-01-20 DIAGNOSIS — I10 Essential (primary) hypertension: Secondary | ICD-10-CM

## 2021-01-20 DIAGNOSIS — J449 Chronic obstructive pulmonary disease, unspecified: Secondary | ICD-10-CM

## 2021-01-20 DIAGNOSIS — Z6836 Body mass index (BMI) 36.0-36.9, adult: Secondary | ICD-10-CM | POA: Diagnosis not present

## 2021-01-20 NOTE — Progress Notes (Signed)
[facility] [place], [place] [address]  Internal MEDICINE  Office Visit Note  Patient Name: Patricia Le  [id number]  [id number]  Date of Service: 01/22/2021  Chief Complaint  Patient presents with  . Follow-up    Weight, allergies and congestion   . Gastroesophageal Reflux  . Hyperlipidemia  . Hypertension  . Anemia    HPI Patient is here for routine follow-up Prescribed Saxenda for weight loss has not been taking as directed due to going on cruise and celebrating her birthday and other family member birthdays the last few months Just started back this week with taking medication and working on healthy lifestyle choices Has not yet had a chance to have her labs done  Breathing remains well controlled, using Symbicort few days per week  Current Medication: Outpatient Encounter Medications as of 01/20/2021  Medication Sig  . albuterol (VENTOLIN HFA) 108 (90 Base) MCG/ACT inhaler Inhale 2 puffs into the lungs 4 (four) times daily as needed for wheezing or shortness of breath.  . budesonide-formoterol (SYMBICORT) 80-4.5 MCG/ACT inhaler Inhale 2 puffs into the lungs daily as needed.  . fluticasone (FLONASE) 50 MCG/ACT nasal spray 2 SPRAYS IN BOTH NOSTRILS EVERY DAY FOR RHINITIS  . gabapentin (NEURONTIN) 300 MG capsule Take 1 capsule (300 mg total) by mouth 3 (three) times daily.  . Liraglutide -Weight Management (SAXENDA) 18 MG/3ML SOPN Inject 3 mg into the skin daily.  . meloxicam (MOBIC) 7.5 MG tablet Take 1 tablet (7.5 mg total) by mouth as needed for pain.  . metoprolol tartrate (LOPRESSOR) 50 MG tablet Take 1 tablet (50 mg total) by mouth 2 (two) times daily.  . Tiotropium Bromide Monohydrate (SPIRIVA RESPIMAT) 2.5 MCG/ACT AERS Inhale 2 puffs into the lungs daily at 6 (six) AM.  . triamcinolone cream (KENALOG) 0.1 % Apply to all affected areas twice daily as needed   No facility-administered encounter medications on file as of 01/20/2021.    Surgical  History: Past Surgical History:  Procedure Laterality Date  . ABDOMINAL HYSTERECTOMY    . BACK SURGERY    . BREAST BIOPSY Right 2015   DCIS  . BREAST LUMPECTOMY Right 2015   DCIS with Mammosite  . BREAST MAMMOSITE Right 07-02-14  . BREAST SURGERY Right 06-06-14   Wide excision of Right breast DCIS  . CESAREAN SECTION    . CHOLECYSTECTOMY    . COLONOSCOPY     over 10 yrs ago/ Dr Kristopher Glee  . COLONOSCOPY  11-20-14   Dr Jamal Collin hyperplastic polyp  . HIATAL HERNIA REPAIR  04/2019  . TONSILLECTOMY    . TUBAL LIGATION      Medical History: Past Medical History:  Diagnosis Date  . Anemia   . 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 Cancer [facility])    DCIS right breast  . Colon polyp   . GERD (gastroesophageal reflux disease)   . Hiatal hernia   . Hyperlipidemia   . Hypertension   . Personal history of radiation therapy   . Restless leg syndrome     Family History: Family History  Problem Relation Age of Onset  . Diabetes Mother   . Cancer Father 78       melanoma  . Breast cancer Neg Hx     Social History   Socioeconomic History  . Marital status: Divorced    Spouse name: Not on file  . Number of children: Not on file  .  Years of education: Not on file  . Highest education level: Not on file  Occupational History  . Not on file  Tobacco Use  . Smoking status: Former Smoker    Packs/day: 1.00    Years: 20.00    Pack years: 20.00    Types: Cigarettes  . Smokeless tobacco: Never Used  Substance and Sexual Activity  . Alcohol use: Yes    Alcohol/week: 0.0 standard drinks    Comment: occasionally  . Drug use: Never  . Sexual activity: Not on file  Other Topics Concern  . Not on file  Social History Narrative  . Not on file   Social Determinants of Health   Financial Resource Strain: Not on file  Food Insecurity: Not on file  Transportation Needs: Not on file  Physical Activity: Not on file  Stress: Not on file   Social Connections: Not on file  Intimate Partner Violence: Not on file      Review of Systems  Constitutional: Negative for chills, diaphoresis and fatigue.  HENT: Negative for ear pain, postnasal drip and sinus pressure.   Eyes: Negative for photophobia, discharge, redness, itching and visual disturbance.  Respiratory: Negative for cough, shortness of breath and wheezing.   Cardiovascular: Negative for chest pain, palpitations and leg swelling.  Gastrointestinal: Negative for abdominal pain, constipation, diarrhea, nausea and vomiting.  Genitourinary: Negative for dysuria and flank pain.  Musculoskeletal: Negative for arthralgias, back pain, gait problem and neck pain.  Skin: Negative for color change.  Allergic/Immunologic: Negative for environmental allergies and food allergies.  Neurological: Negative for dizziness and headaches.  Hematological: Does not bruise/bleed easily.  Psychiatric/Behavioral: Negative for agitation, behavioral problems (depression) and hallucinations.    Vital Signs: BP 122/86   Pulse 100   Temp (!) 97.3 F (36.3 C)   Resp 16   Ht 4\' 11"  (1.499 m)   Wt 181 lb 9.6 oz (82.4 kg)   SpO2 98%   BMI 36.68 kg/m    Physical Exam Vitals reviewed.  Constitutional:      Appearance: Normal appearance. She is obese.  Cardiovascular:     Rate and Rhythm: Normal rate and regular rhythm.     Pulses: Normal pulses.     Heart sounds: Normal heart sounds.  Pulmonary:     Effort: Pulmonary effort is normal.     Breath sounds: Normal breath sounds.  Abdominal:     General: Abdomen is flat.     Palpations: Abdomen is soft.  Musculoskeletal:        General: Normal range of motion.     Cervical back: Normal range of motion.  Skin:    General: Skin is warm.  Neurological:     General: No focal deficit present.     Mental Status: She is alert and oriented to person, place, and time. Mental status is at baseline.  Psychiatric:        Mood and Affect: Mood  normal.        Behavior: Behavior normal.        Thought Content: Thought content normal.        Judgment: Judgment normal.    Assessment/Plan: 1. Essential hypertension BP and HR well controlled, continue with present management  2. Chronic obstructive pulmonary disease, unspecified COPD type ([place]) Will need updated PFT Continue with Symbicort, discussed using daily to receive optimum results  3. BMI 36.0-36.9,adult Continue with Saxenda and healthy lifestyle Obesity Counseling: Risk Assessment: An assessment of behavioral risk factors was made  today and includes lack of exercise sedentary lifestyle, lack of portion control and poor dietary habits.  Risk Modification Advice: She was counseled on portion control guidelines. Restricting daily caloric intake to 1800. The detrimental long term effects of obesity on her health and ongoing poor compliance was also discussed with the patient.  General Counseling: Janaiah verbalizes understanding of the findings of todays visit and agrees with plan of treatment. I have discussed any further diagnostic evaluation that may be needed or ordered today. We also reviewed her medications today. she has been encouraged to call the office with any questions or concerns that should arise related to todays visit.   Time spent:30 Minutes Time spent includes review of chart, medications, test results and follow-up plan with the patient.,  This patient was seen by Theodoro Grist AGNP-C in Collaboration with Dr Lavera Guise as a part of collaborative care agreement     Tanna Furry. Canon Gola AGNP-C Internal medicine

## 2021-01-22 ENCOUNTER — Encounter: Payer: Self-pay | Admitting: Hospice and Palliative Medicine

## 2021-03-03 LAB — CBC WITH DIFFERENTIAL/PLATELET
Basophils Absolute: 0.1 10*3/uL (ref 0.0–0.2)
Basos: 1 %
EOS (ABSOLUTE): 0.1 10*3/uL (ref 0.0–0.4)
Eos: 2 %
Hematocrit: 40.6 % (ref 34.0–46.6)
Hemoglobin: 14 g/dL (ref 11.1–15.9)
Immature Grans (Abs): 0.1 10*3/uL (ref 0.0–0.1)
Immature Granulocytes: 1 %
Lymphocytes Absolute: 3 10*3/uL (ref 0.7–3.1)
Lymphs: 46 %
MCH: 30.6 pg (ref 26.6–33.0)
MCHC: 34.5 g/dL (ref 31.5–35.7)
MCV: 89 fL (ref 79–97)
Monocytes Absolute: 0.7 10*3/uL (ref 0.1–0.9)
Monocytes: 11 %
Neutrophils Absolute: 2.5 10*3/uL (ref 1.4–7.0)
Neutrophils: 39 %
Platelets: 273 10*3/uL (ref 150–450)
RBC: 4.58 x10E6/uL (ref 3.77–5.28)
RDW: 12.9 % (ref 11.7–15.4)
WBC: 6.5 10*3/uL (ref 3.4–10.8)

## 2021-03-03 LAB — LIPID PANEL WITH LDL/HDL RATIO
Cholesterol, Total: 200 mg/dL — ABNORMAL HIGH (ref 100–199)
HDL: 48 mg/dL (ref >39)
LDL Chol Calc (NIH): 133 mg/dL — ABNORMAL HIGH (ref 0–99)
LDL/HDL Ratio: 2.8 ratio (ref 0.0–3.2)
Triglycerides: 103 mg/dL (ref 0–149)
VLDL Cholesterol Cal: 19 mg/dL (ref 5–40)

## 2021-03-03 LAB — COMPREHENSIVE METABOLIC PANEL
ALT: 25 IU/L (ref 0–32)
AST: 22 IU/L (ref 0–40)
Albumin/Globulin Ratio: 2.1 (ref 1.2–2.2)
Albumin: 4.6 g/dL (ref 3.8–4.8)
Alkaline Phosphatase: 78 IU/L (ref 44–121)
BUN/Creatinine Ratio: 14 (ref 12–28)
BUN: 10 mg/dL (ref 8–27)
Bilirubin Total: 0.4 mg/dL (ref 0.0–1.2)
CO2: 22 mmol/L (ref 20–29)
Calcium: 9.6 mg/dL (ref 8.7–10.3)
Chloride: 104 mmol/L (ref 96–106)
Creatinine, Ser: 0.74 mg/dL (ref 0.57–1.00)
Globulin, Total: 2.2 g/dL (ref 1.5–4.5)
Glucose: 93 mg/dL (ref 65–99)
Potassium: 4.5 mmol/L (ref 3.5–5.2)
Sodium: 142 mmol/L (ref 134–144)
Total Protein: 6.8 g/dL (ref 6.0–8.5)
eGFR: 91 mL/min/{1.73_m2} (ref >59)

## 2021-03-03 LAB — TSH+FREE T4
Free T4: 1.25 ng/dL (ref 0.82–1.77)
TSH: 2.01 u[IU]/mL (ref 0.450–4.500)

## 2021-03-03 LAB — VITAMIN B12: Vitamin B-12: 429 pg/mL (ref 232–1245)

## 2021-05-20 ENCOUNTER — Telehealth: Payer: Self-pay

## 2021-05-20 ENCOUNTER — Other Ambulatory Visit: Payer: Self-pay | Admitting: Nurse Practitioner

## 2021-05-20 DIAGNOSIS — I10 Essential (primary) hypertension: Secondary | ICD-10-CM

## 2021-05-20 NOTE — Telephone Encounter (Signed)
PA sent for SAXENDA 18 mg/55m at 412pm

## 2021-05-25 ENCOUNTER — Other Ambulatory Visit: Payer: Self-pay | Admitting: Nurse Practitioner

## 2021-05-25 DIAGNOSIS — I10 Essential (primary) hypertension: Secondary | ICD-10-CM

## 2021-05-27 ENCOUNTER — Telehealth: Payer: Self-pay

## 2021-05-27 DIAGNOSIS — Z6835 Body mass index (BMI) 35.0-35.9, adult: Secondary | ICD-10-CM

## 2021-05-27 MED ORDER — SAXENDA 18 MG/3ML ~~LOC~~ SOPN: 3.0000 mg | PEN_INJECTOR | Freq: Every day | SUBCUTANEOUS | 3 refills | Status: DC

## 2021-05-27 NOTE — Telephone Encounter (Signed)
PA for SAXENDA 18 mg/3 ml was sent on 05/20/21  05/21/21 PA for SAXENDA was approved.  New rx sent to pharmacy

## 2021-05-28 ENCOUNTER — Ambulatory Visit: Payer: Managed Care, Other (non HMO) | Admitting: Physician Assistant

## 2021-05-28 ENCOUNTER — Other Ambulatory Visit: Payer: Self-pay

## 2021-05-29 ENCOUNTER — Other Ambulatory Visit: Payer: Self-pay

## 2021-05-29 MED ORDER — GABAPENTIN 300 MG PO CAPS
300.0000 mg | ORAL_CAPSULE | Freq: Three times a day (TID) | ORAL | 1 refills | Status: DC
Start: 2021-05-29 — End: 2021-11-16

## 2021-07-07 ENCOUNTER — Other Ambulatory Visit: Payer: Self-pay

## 2021-07-07 DIAGNOSIS — I10 Essential (primary) hypertension: Secondary | ICD-10-CM

## 2021-07-07 MED ORDER — METOPROLOL TARTRATE 50 MG PO TABS: 50.0000 mg | ORAL_TABLET | Freq: Two times a day (BID) | ORAL | 0 refills | Status: DC

## 2021-07-09 ENCOUNTER — Other Ambulatory Visit: Payer: Self-pay

## 2021-07-09 ENCOUNTER — Ambulatory Visit: Payer: Managed Care, Other (non HMO) | Admitting: Nurse Practitioner

## 2021-07-09 ENCOUNTER — Encounter: Payer: Self-pay | Admitting: Nurse Practitioner

## 2021-07-09 VITALS — BP 122/82 | HR 72 | Temp 97.7°F | Resp 16 | Ht 59.0 in | Wt 186.2 lb

## 2021-07-09 DIAGNOSIS — E782 Mixed hyperlipidemia: Secondary | ICD-10-CM | POA: Diagnosis not present

## 2021-07-09 DIAGNOSIS — E559 Vitamin D deficiency, unspecified: Secondary | ICD-10-CM

## 2021-07-09 DIAGNOSIS — D649 Anemia, unspecified: Secondary | ICD-10-CM

## 2021-07-09 DIAGNOSIS — Z1231 Encounter for screening mammogram for malignant neoplasm of breast: Secondary | ICD-10-CM

## 2021-07-09 DIAGNOSIS — I10 Essential (primary) hypertension: Secondary | ICD-10-CM

## 2021-07-09 DIAGNOSIS — Z23 Encounter for immunization: Secondary | ICD-10-CM

## 2021-07-09 MED ORDER — MELOXICAM 7.5 MG PO TABS
7.5000 mg | ORAL_TABLET | ORAL | 1 refills | Status: DC | PRN
Start: 2021-07-09 — End: 2021-11-16

## 2021-07-09 MED ORDER — PNEUMOCOCCAL 20-VAL CONJ VACC 0.5 ML IM SUSY: 0.5000 mL | PREFILLED_SYRINGE | Freq: Once | INTRAMUSCULAR | 0 refills | Status: AC

## 2021-07-09 MED ORDER — METOPROLOL TARTRATE 50 MG PO TABS: 50.0000 mg | ORAL_TABLET | Freq: Two times a day (BID) | ORAL | 1 refills | Status: DC

## 2021-07-09 MED ORDER — METOPROLOL TARTRATE 50 MG PO TABS: 50.0000 mg | ORAL_TABLET | Freq: Two times a day (BID) | ORAL | 0 refills | Status: DC

## 2021-07-09 MED ORDER — SHINGRIX 50 MCG/0.5ML IM SUSR: 0.5000 mL | Freq: Once | INTRAMUSCULAR | 0 refills | Status: AC

## 2021-07-09 NOTE — Progress Notes (Signed)
[facility] [place], [place] [address]   Internal MEDICINE   Established patient visit  Patient: Patricia Le   DOB: [birth year]   65 y.o. Female  MRN: [id number] Visit Date: 07/09/2021  Today's healthcare provider: Jonetta Osgood, NP  Subjective:    Chief Complaint  Patient presents with   Medication Refill   HPI Patricia Le presents for a follow up visit for medication refills. She reports increased stress but she is managing well. She has recently admitted her mother to a skilled nursing facility and has had some additional things to get squared away for her mother which has increased her stress level. She reports that this is all starting to calm down and she is feeling better. She needs refills of meloxicam and metoprolol. She is due for her screening mammogram. Her annual wellness visit is scheduled for 07/24/21 and she is also due for lab work.    Medications: Outpatient Medications Prior to Visit  Medication Sig   albuterol (VENTOLIN HFA) 108 (90 Base) MCG/ACT inhaler Inhale 2 puffs into the lungs 4 (four) times daily as needed for wheezing or shortness of breath.   budesonide-formoterol (SYMBICORT) 80-4.5 MCG/ACT inhaler Inhale 2 puffs into the lungs daily as needed.   fluticasone (FLONASE) 50 MCG/ACT nasal spray 2 SPRAYS IN BOTH NOSTRILS EVERY DAY FOR RHINITIS   gabapentin (NEURONTIN) 300 MG capsule Take 1 capsule (300 mg total) by mouth 3 (three) times daily.   Liraglutide -Weight Management (SAXENDA) 18 MG/3ML SOPN Inject 3 mg into the skin daily.   Tiotropium Bromide Monohydrate (SPIRIVA RESPIMAT) 2.5 MCG/ACT AERS Inhale 2 puffs into the lungs daily at 6 (six) AM.   triamcinolone cream (KENALOG) 0.1 % Apply to all affected areas twice daily as needed   [DISCONTINUED] metoprolol tartrate (LOPRESSOR) 50 MG tablet Take 1 tablet (50 mg total) by mouth 2 (two) times daily.   [DISCONTINUED] PNEUMOCOCCAL 20-VAL CONJ VACC IM Inject into the muscle.    [DISCONTINUED] Zoster Vaccine Adjuvanted [facility]) injection Inject 0.5 mLs into the muscle once.   [DISCONTINUED] meloxicam (MOBIC) 7.5 MG tablet Take 1 tablet (7.5 mg total) by mouth as needed for pain.   [DISCONTINUED] metoprolol tartrate (LOPRESSOR) 50 MG tablet Take 1 tablet (50 mg total) by mouth 2 (two) times daily.   No facility-administered medications prior to visit.    Review of Systems  Constitutional:  Negative for chills, fatigue and unexpected weight change.  HENT:  Negative for congestion, rhinorrhea, sneezing and sore throat.   Eyes:  Negative for redness.  Respiratory:  Negative for cough, chest tightness and shortness of breath.   Cardiovascular:  Negative for chest pain and palpitations.  Gastrointestinal:  Negative for abdominal pain, constipation, diarrhea, nausea and vomiting.  Genitourinary:  Negative for dysuria and frequency.  Musculoskeletal:  Negative for arthralgias, back pain, joint swelling and neck pain.  Skin:  Negative for rash.  Neurological: Negative.  Negative for tremors and numbness.  Hematological:  Negative for adenopathy. Does not bruise/bleed easily.  Psychiatric/Behavioral:  Negative for behavioral problems (Depression), self-injury, sleep disturbance and suicidal ideas. The patient is nervous/anxious.        Increased stress level, manageable.      Objective:    BP 122/82   Pulse 72   Temp 97.7 F (36.5 C)   Resp 16   Ht _0  (1.499 m)   Wt 186 lb 3.2 oz (84.5 kg)   SpO2 97%   BMI 37.61 kg/m  Physical Exam Vitals reviewed.  Constitutional:      General: She is not in acute distress.    Appearance: Normal appearance. She is obese. She is not ill-appearing.  HENT:     Head: Normocephalic and atraumatic.  Eyes:     Extraocular Movements: Extraocular movements intact.     Pupils: Pupils are equal, round, and reactive to light.  Cardiovascular:     Rate and Rhythm: Normal rate and regular rhythm.  Pulmonary:     Effort:  Pulmonary effort is normal.     Breath sounds: Normal breath sounds.  Neurological:     Mental Status: She is alert and oriented to person, place, and time.     Cranial Nerves: No cranial nerve deficit.     Coordination: Coordination normal.     Gait: Gait normal.  Psychiatric:        Mood and Affect: Mood normal.        Behavior: Behavior normal.        Assessment & Plan:    1. Essential hypertension Stable with current medications, continue as prescribed - metoprolol tartrate (LOPRESSOR) 50 MG tablet; Take 1 tablet (50 mg total) by mouth 2 (two) times daily.  Dispense: 90 tablet; Refill: 1  2. Mixed hyperlipidemia Routine labs ordered - CMP14+EGFR - Lipid Profile - TSH + free T4  3. Vitamin D deficiency Rule out low vitamin D - Vitamin D (25 hydroxy)  4. Anemia, unspecified type Routine labs ordered - CBC with Differential/Platelet - CMP14+EGFR  5. Encounter for screening mammogram for malignant neoplasm of breast Routine mammogram ordered - MM DIGITAL SCREENING BILATERAL; Future  Orders Placed This Encounter  Procedures   MM DIGITAL SCREENING BILATERAL    Standing Status:   Future    Standing Expiration Date:   07/09/2022    Order Specific Question:   Reason for Exam (SYMPTOM  OR DIAGNOSIS REQUIRED)    Answer:   SCREENING for breast cancer    Order Specific Question:   Preferred imaging location?    Answer:   [facility]   CBC with Differential/Platelet   CMP14+EGFR   Lipid Profile   Vitamin D (25 hydroxy)   TSH + free T4   Meds ordered this encounter  Medications   meloxicam (MOBIC) 7.5 MG tablet    Sig: Take 1 tablet (7.5 mg total) by mouth as needed for pain.    Dispense:  90 tablet    Refill:  1   DISCONTD: metoprolol tartrate (LOPRESSOR) 50 MG tablet    Sig: Take 1 tablet (50 mg total) by mouth 2 (two) times daily.    Dispense:  180 tablet    Refill:  1    Pt need appt for refills   pneumococcal 20-valent conjugate vaccine (PREVNAR 20) 0.5  ML injection    Sig: Inject 0.5 mLs into the muscle once for 1 dose.    Dispense:  0.5 mL    Refill:  0   Zoster Vaccine Adjuvanted [facility]) injection    Sig: Inject 0.5 mLs into the muscle once for 1 dose.    Dispense:  0.5 mL    Refill:  0   metoprolol tartrate (LOPRESSOR) 50 MG tablet    Sig: Take 1 tablet (50 mg total) by mouth 2 (two) times daily.    Dispense:  90 tablet    Refill:  1    Pt need appt for refills      Return in 15 days (on 07/24/2021) for previously  scheduled, CPE, [place] PCP.  [place] Controlled Substance Database was reviewed by me for overdose risk score (ORS)  This patient was seen by Jonetta Osgood, FNP-C in collaboration with Dr. Clayborn Bigness as a part of collaborative care agreement.  Jonetta Osgood, MSN, FNP-C  [facility], [place] [phone] (phone) [phone] (fax)

## 2021-07-24 ENCOUNTER — Other Ambulatory Visit: Payer: Self-pay

## 2021-07-24 ENCOUNTER — Ambulatory Visit (INDEPENDENT_AMBULATORY_CARE_PROVIDER_SITE_OTHER): Payer: Managed Care, Other (non HMO) | Admitting: Nurse Practitioner

## 2021-07-24 ENCOUNTER — Encounter: Payer: Self-pay | Admitting: Nurse Practitioner

## 2021-07-24 VITALS — BP 115/86 | HR 83 | Temp 98.6°F | Resp 16 | Ht 59.0 in | Wt 186.8 lb

## 2021-07-24 DIAGNOSIS — Z124 Encounter for screening for malignant neoplasm of cervix: Secondary | ICD-10-CM | POA: Diagnosis not present

## 2021-07-24 DIAGNOSIS — R7301 Impaired fasting glucose: Secondary | ICD-10-CM

## 2021-07-24 DIAGNOSIS — Z0001 Encounter for general adult medical examination with abnormal findings: Secondary | ICD-10-CM | POA: Diagnosis not present

## 2021-07-24 DIAGNOSIS — I10 Essential (primary) hypertension: Secondary | ICD-10-CM

## 2021-07-24 DIAGNOSIS — R7303 Prediabetes: Secondary | ICD-10-CM | POA: Diagnosis not present

## 2021-07-24 DIAGNOSIS — R300 Dysuria: Secondary | ICD-10-CM

## 2021-07-24 DIAGNOSIS — Z113 Encounter for screening for infections with a predominantly sexual mode of transmission: Secondary | ICD-10-CM

## 2021-07-24 LAB — POCT GLYCOSYLATED HEMOGLOBIN (HGB A1C): Hemoglobin A1C: 6 % — AB (ref 4.0–5.6)

## 2021-07-24 NOTE — Progress Notes (Signed)
[facility] [place], [place] [address]  Internal MEDICINE  Office Visit Note  Patient Name: Patricia Le  [id number]  [id number]  Date of Service: 07/24/2021  Chief Complaint  Patient presents with   Annual Exam   Gastroesophageal Reflux   Hyperlipidemia   Hypertension   Anemia    HPI Patricia Le presents for an annual well visit and physical exam. She is a well appearing 64 yo female. She has hypertension, COPD, GERD and hyperlipidemia. Her blood pressure is well controlled on metoprolol. Her breathing is stable, she takes symbicort and spiriva for maintenance inhaler and albuterol as needed. She denies any SOB, wheezing, or persistent cough.  She is taking saxenda for weight management and has been on this medication for an extended period of time. She denies pain but does report having sore muscles from covid and flu shots this season. She reports that she needs the pneumonia vaccine.     Current Medication: Outpatient Encounter Medications as of 07/24/2021  Medication Sig   albuterol (VENTOLIN HFA) 108 (90 Base) MCG/ACT inhaler Inhale 2 puffs into the lungs 4 (four) times daily as needed for wheezing or shortness of breath.   budesonide-formoterol (SYMBICORT) 80-4.5 MCG/ACT inhaler Inhale 2 puffs into the lungs daily as needed.   fluticasone (FLONASE) 50 MCG/ACT nasal spray 2 SPRAYS IN BOTH NOSTRILS EVERY DAY FOR RHINITIS   gabapentin (NEURONTIN) 300 MG capsule Take 1 capsule (300 mg total) by mouth 3 (three) times daily.   Liraglutide -Weight Management (SAXENDA) 18 MG/3ML SOPN Inject 3 mg into the skin daily.   meloxicam (MOBIC) 7.5 MG tablet Take 1 tablet (7.5 mg total) by mouth as needed for pain.   metoprolol tartrate (LOPRESSOR) 50 MG tablet Take 1 tablet (50 mg total) by mouth 2 (two) times daily.   Tiotropium Bromide Monohydrate (SPIRIVA RESPIMAT) 2.5 MCG/ACT AERS Inhale 2 puffs into the lungs daily at 6 (six) AM.   triamcinolone cream (KENALOG)  0.1 % Apply to all affected areas twice daily as needed   No facility-administered encounter medications on file as of 07/24/2021.    Surgical History: Past Surgical History:  Procedure Laterality Date   ABDOMINAL HYSTERECTOMY     BACK SURGERY     BREAST BIOPSY Right 2015   DCIS   BREAST LUMPECTOMY Right 2015   DCIS with Mammosite   BREAST MAMMOSITE Right 07-02-14   BREAST SURGERY Right 06-06-14   Wide excision of Right breast DCIS   CESAREAN SECTION     CHOLECYSTECTOMY     COLONOSCOPY     over 10 yrs ago/ Dr Kristopher Glee   COLONOSCOPY  11-20-14   Dr Jamal Collin hyperplastic polyp   HIATAL HERNIA REPAIR  04/2019   TONSILLECTOMY     TUBAL LIGATION      Medical History: Past Medical History:  Diagnosis Date   Anemia    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; 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%, 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%   Cancer (HCC)    DCIS right breast   Colon polyp    GERD (gastroesophageal reflux disease)    Hiatal hernia    Hyperlipidemia    Hypertension    Personal history of radiation therapy    Restless leg syndrome     Family History: Family History  Problem Relation Age of Onset   Diabetes Mother    Cancer Father 16       melanoma   Breast cancer Neg Hx     Social History  Socioeconomic History   Marital status: Divorced    Spouse name: Not on file   Number of children: Not on file   Years of education: Not on file   Highest education level: Not on file  Occupational History   Not on file  Tobacco Use   Smoking status: Former    Packs/day: 1.00    Years: 20.00    Pack years: 20.00    Types: Cigarettes   Smokeless tobacco: Never  Substance and Sexual Activity   Alcohol use: Yes    Alcohol/week: 0.0 standard drinks    Comment: occasionally   Drug use: Never   Sexual activity: Not on file  Other Topics Concern   Not on file  Social History Narrative   Not on file   Social Determinants of Health   Financial Resource Strain: Not on file  Food Insecurity: Not on file   Transportation Needs: Not on file  Physical Activity: Not on file  Stress: Not on file  Social Connections: Not on file  Intimate Partner Violence: Not on file      Review of Systems  Constitutional:  Negative for activity change, appetite change, chills, fatigue, fever and unexpected weight change.  HENT: Negative.  Negative for congestion, ear pain, rhinorrhea, sore throat and trouble swallowing.   Eyes: Negative.   Respiratory: Negative.  Negative for cough, chest tightness, shortness of breath and wheezing.   Cardiovascular: Negative.  Negative for chest pain.  Gastrointestinal: Negative.  Negative for abdominal pain, blood in stool, constipation, diarrhea, nausea and vomiting.  Endocrine: Negative.   Genitourinary: Negative.  Negative for difficulty urinating, dysuria, frequency, hematuria and urgency.  Musculoskeletal: Negative.  Negative for arthralgias, back pain, joint swelling, myalgias and neck pain.  Skin: Negative.  Negative for rash and wound.  Allergic/Immunologic: Negative.  Negative for immunocompromised state.  Neurological: Negative.  Negative for dizziness, seizures, numbness and headaches.  Hematological: Negative.   Psychiatric/Behavioral: Negative.  Negative for behavioral problems, self-injury and suicidal ideas. The patient is not nervous/anxious.    Vital Signs: BP 115/86   Pulse 83   Temp 98.6 F (37 C)   Resp 16   Ht 4\' 11"  (1.499 m)   Wt 186 lb 12.8 oz (84.7 kg)   SpO2 99%   BMI 37.73 kg/m    Physical Exam Vitals reviewed.  Constitutional:      General: She is awake. She is not in acute distress.    Appearance: Normal appearance. She is well-developed. She is obese. She is not ill-appearing or diaphoretic.  HENT:     Head: Normocephalic and atraumatic.     Right Ear: Tympanic membrane, ear canal and external ear normal.     Left Ear: Tympanic membrane, ear canal and external ear normal.     Nose: Nose normal. No congestion or rhinorrhea.      Mouth/Throat:     Lips: Pink.     Mouth: Mucous membranes are moist.     Pharynx: Oropharynx is clear. Uvula midline. No oropharyngeal exudate or posterior oropharyngeal erythema.  Eyes:     General: Lids are normal. Vision grossly intact. Gaze aligned appropriately. No scleral icterus.       Right eye: No discharge.        Left eye: No discharge.     Extraocular Movements: Extraocular movements intact.     Conjunctiva/sclera: Conjunctivae normal.     Pupils: Pupils are equal, round, and reactive to light.     Funduscopic exam:  Right eye: Red reflex present.        Left eye: Red reflex present. Neck:     Thyroid: No thyromegaly.     Vascular: No JVD.     Trachea: Trachea and phonation normal. No tracheal deviation.  Cardiovascular:     Rate and Rhythm: Normal rate and regular rhythm.     Pulses: Normal pulses.     Heart sounds: Normal heart sounds, S1 normal and S2 normal. No murmur heard.   No friction rub. No gallop.  Pulmonary:     Effort: Pulmonary effort is normal. No accessory muscle usage or respiratory distress.     Breath sounds: Normal breath sounds and air entry. No stridor. No wheezing or rales.  Chest:     Chest wall: No tenderness.  Breasts:    Right: Normal. No swelling, bleeding, inverted nipple, mass, nipple discharge, skin change or tenderness.     Left: Normal. No swelling, bleeding, inverted nipple, mass, nipple discharge, skin change or tenderness.  Abdominal:     General: Bowel sounds are normal. There is no distension.     Palpations: Abdomen is soft. There is no shifting dullness, fluid wave, mass or pulsatile mass.     Tenderness: There is no abdominal tenderness. There is no guarding or rebound.     Hernia: There is no hernia in the left inguinal area or right inguinal area.  Genitourinary:    General: Normal vulva.     Exam position: Lithotomy position.     Labia:        Right: No rash, tenderness, lesion or injury.        Left: No rash,  tenderness, lesion or injury.      Urethra: No prolapse, urethral swelling or urethral lesion.     Vagina: Normal. No signs of injury and foreign body. No vaginal discharge, erythema, tenderness, bleeding, lesions or prolapsed vaginal walls.     Cervix: No cervical motion tenderness, discharge, friability, lesion, erythema, cervical bleeding or eversion.     Uterus: Normal. Not deviated, not enlarged, not fixed and no uterine prolapse.      Adnexa: Right adnexa normal and left adnexa normal.     Rectum: No tenderness or external hemorrhoid. Normal anal tone.  Musculoskeletal:        General: No tenderness or deformity. Normal range of motion.     Cervical back: Normal range of motion and neck supple.  Lymphadenopathy:     Cervical: No cervical adenopathy.     Upper Body:     Right upper body: No supraclavicular, axillary or pectoral adenopathy.     Left upper body: No supraclavicular, axillary or pectoral adenopathy.     Lower Body: No right inguinal adenopathy. No left inguinal adenopathy.  Skin:    General: Skin is warm and dry.     Capillary Refill: Capillary refill takes less than 2 seconds.     Coloration: Skin is not pale.     Findings: No erythema or rash.  Neurological:     Mental Status: She is alert and oriented to person, place, and time.     Cranial Nerves: No cranial nerve deficit.     Motor: No abnormal muscle tone.     Coordination: Coordination normal.     Gait: Gait normal.     Deep Tendon Reflexes: Reflexes are normal and symmetric.  Psychiatric:        Mood and Affect: Mood and affect normal.  Behavior: Behavior normal. Behavior is cooperative.        Thought Content: Thought content normal.        Judgment: Judgment normal.       Assessment/Plan: 1. Encounter for routine adult health examination with abnormal findings Age-appropriate preventive screenings and vaccinations discussed, annual physical exam completed. Routine labs for health maintenance  ordered on 07/09/21 but have not been drawn yet. Patient was reminded to have the labs drawn.Marland Kitchen PHM updated.   2. Prediabetes A1C is elevated at 6.0, considered prediabetic. Discussed simple diet and lifestyle modifications that will help improve A1C  3. Essential hypertension Blood pressure is stable, continue medication as prescribed.   4. Impaired fasting glucose Abnormal fasting glucose levels in the past, A1C checked.  - POCT HgB A1C  5. Routine cervical smear Routine pap done, will call patient with results  - IGP, Aptima HPV  6. Routine screening for STI (sexually transmitted infection) Nuswab specimen obtained and sent - NuSwab Vaginitis Plus (VG+)  7. Dysuria Routine urinalysis done - UA/M w/rflx Culture, Routine      General Counseling: Patricia Le verbalizes understanding of the findings of todays visit and agrees with plan of treatment. I have discussed any further diagnostic evaluation that may be needed or ordered today. We also reviewed her medications today. she has been encouraged to call the office with any questions or concerns that should arise related to todays visit.    Orders Placed This Encounter  Procedures   Microscopic Examination   NuSwab Vaginitis Plus (VG+)   UA/M w/rflx Culture, Routine   POCT HgB A1C    No orders of the defined types were placed in this encounter.   Return in about 6 months (around 01/21/2022) for F/U, med refill, Tex Conroy PCP.   Total time spent:30 Minutes Time spent includes review of chart, medications, test results, and follow up plan with the patient.   [place] Controlled Substance Database was reviewed by me.  This patient was seen by Jonetta Osgood, FNP-C in collaboration with Dr. Clayborn Bigness as a part of collaborative care agreement.  Drew Herman R. Valetta Fuller, MSN, FNP-C Internal medicine

## 2021-07-25 LAB — UA/M W/RFLX CULTURE, ROUTINE
Bilirubin, UA: NEGATIVE
Glucose, UA: NEGATIVE
Ketones, UA: NEGATIVE
Leukocytes,UA: NEGATIVE
Nitrite, UA: NEGATIVE
Protein,UA: NEGATIVE
RBC, UA: NEGATIVE
Specific Gravity, UA: 1.023 (ref 1.005–1.030)
Urobilinogen, Ur: 1 mg/dL (ref 0.2–1.0)
pH, UA: 5.5 (ref 5.0–7.5)

## 2021-07-25 LAB — MICROSCOPIC EXAMINATION
Bacteria, UA: NONE SEEN
Casts: NONE SEEN /lpf
Epithelial Cells (non renal): >10 /hpf — AB (ref 0–10)
RBC, Urine: NONE SEEN /hpf (ref 0–2)

## 2021-07-27 LAB — NUSWAB VAGINITIS PLUS (VG+)
Candida albicans, NAA: NEGATIVE
Candida glabrata, NAA: NEGATIVE
Chlamydia trachomatis, NAA: NEGATIVE
Neisseria gonorrhoeae, NAA: NEGATIVE
Trich vag by NAA: NEGATIVE

## 2021-07-29 LAB — IGP, APTIMA HPV: HPV Aptima: NEGATIVE

## 2021-08-20 ENCOUNTER — Other Ambulatory Visit: Payer: Self-pay

## 2021-08-20 ENCOUNTER — Ambulatory Visit
Admission: RE | Admit: 2021-08-20 | Discharge: 2021-08-20 | Disposition: A | Discharge status: Home / Self Care | Payer: Managed Care, Other (non HMO) | Source: Ambulatory Visit | Attending: Nurse Practitioner | Admitting: Nurse Practitioner

## 2021-08-20 DIAGNOSIS — Z1231 Encounter for screening mammogram for malignant neoplasm of breast: Secondary | ICD-10-CM | POA: Insufficient documentation

## 2021-09-18 ENCOUNTER — Telehealth: Payer: Self-pay

## 2021-09-18 NOTE — Telephone Encounter (Signed)
Disability parking placard completed by provider and held at front desk. Patient was advised.

## 2021-09-25 IMAGING — MG DIGITAL SCREENING BILAT W/ TOMO W/ CAD
8 series · 8 of 24 positions shown · non-contrast
Comparison: Previous exam(s).

CLINICAL DATA: Screening.

EXAM:
DIGITAL SCREENING BILATERAL MAMMOGRAM WITH TOMO AND CAD

[L CC synth-2D]
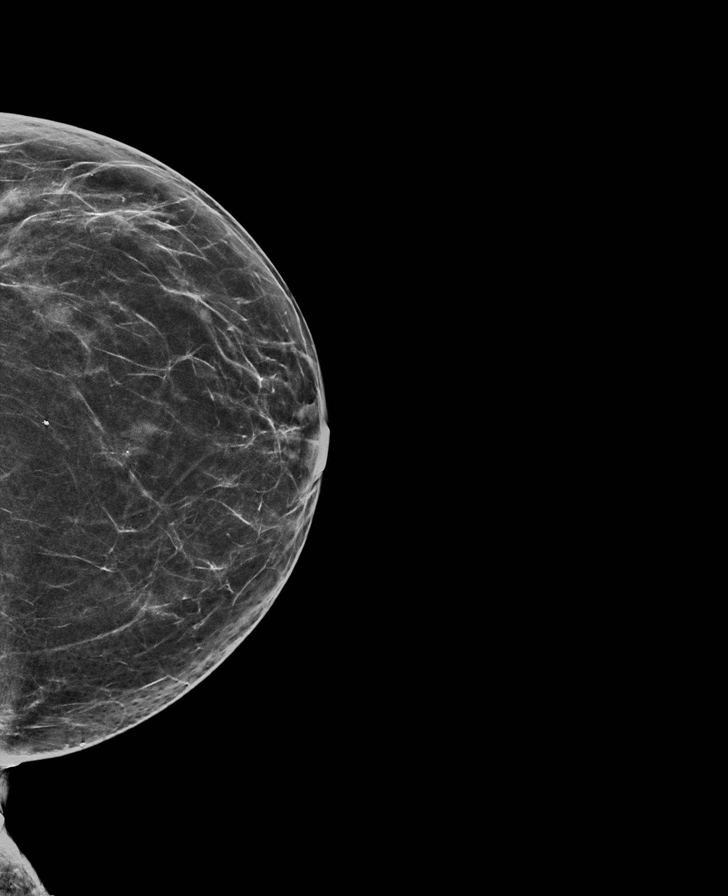

[R CC synth-2D]
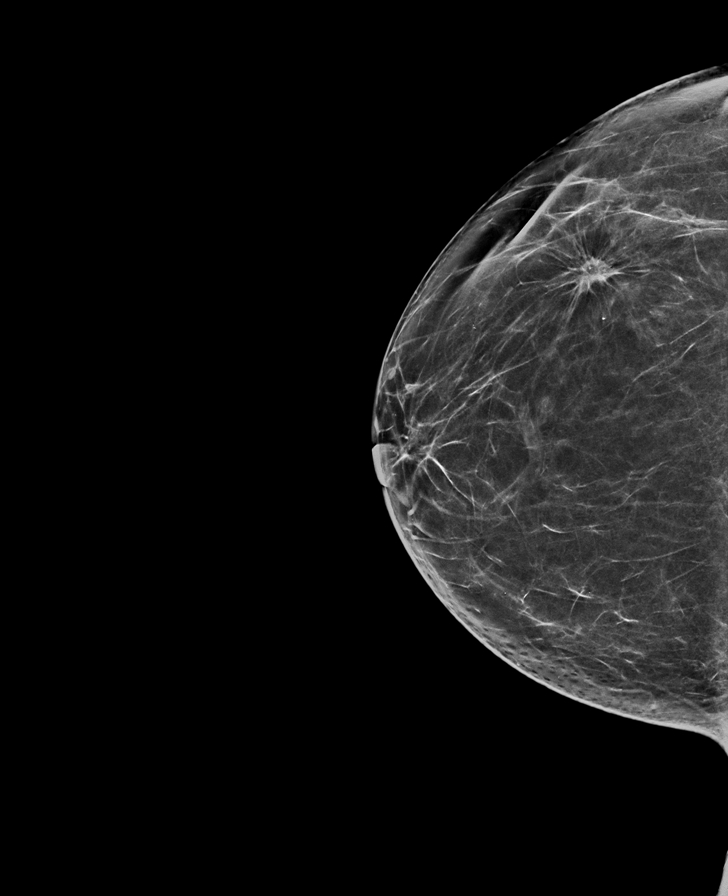

[R MLO synth-2D]
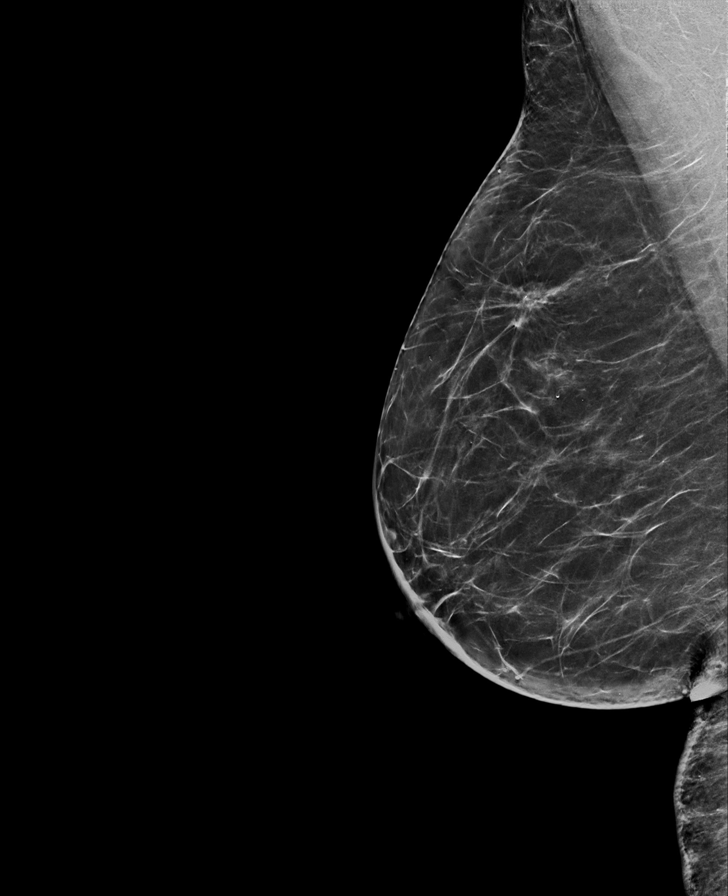

[L MLO synth-2D]
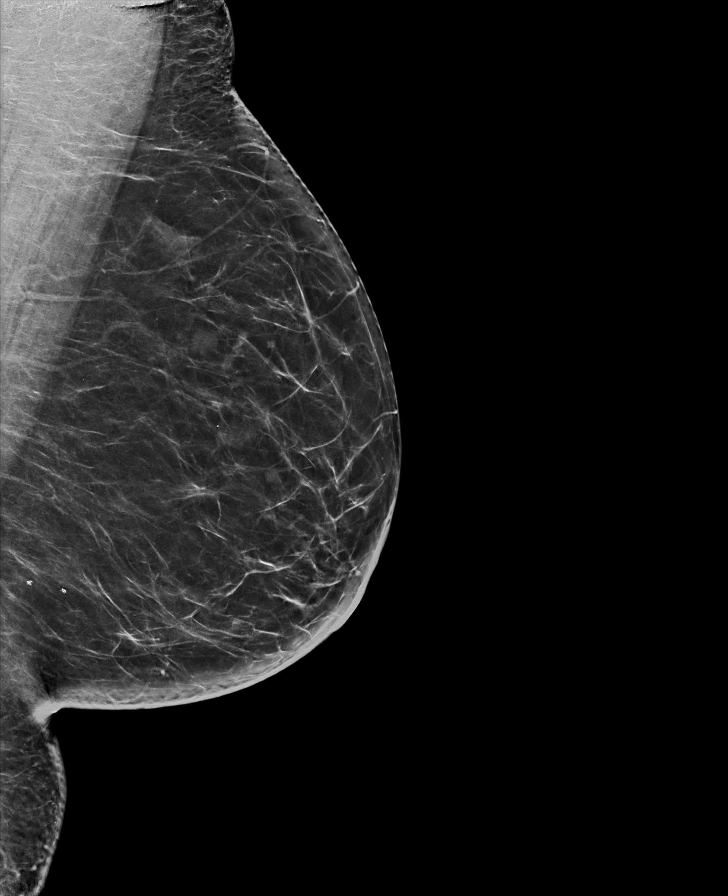

[R MLO tomo · tomo slice 37/74.0]
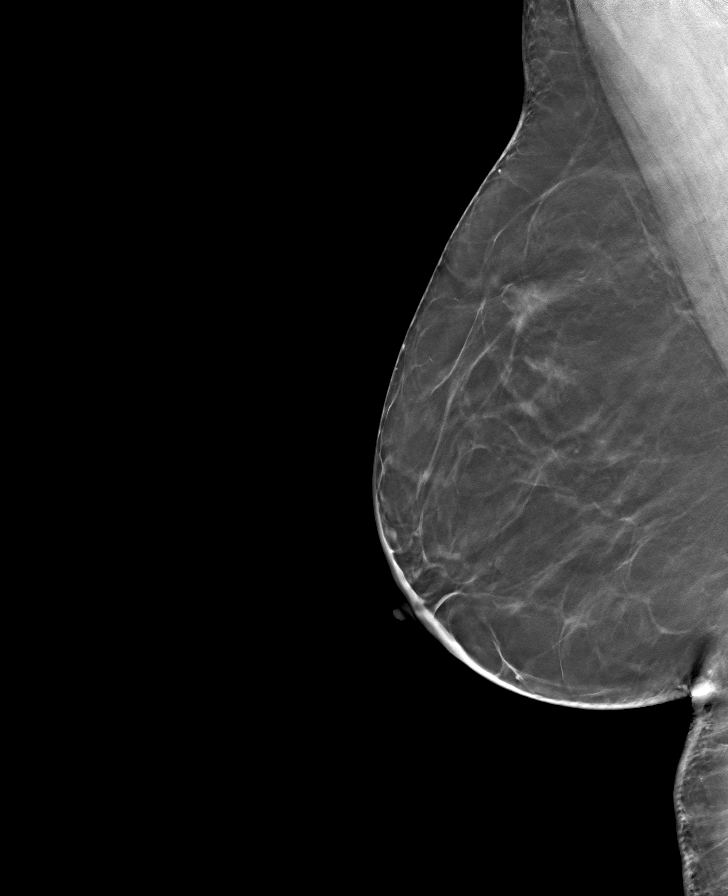

[L MLO tomo · tomo slice 37/73.0]
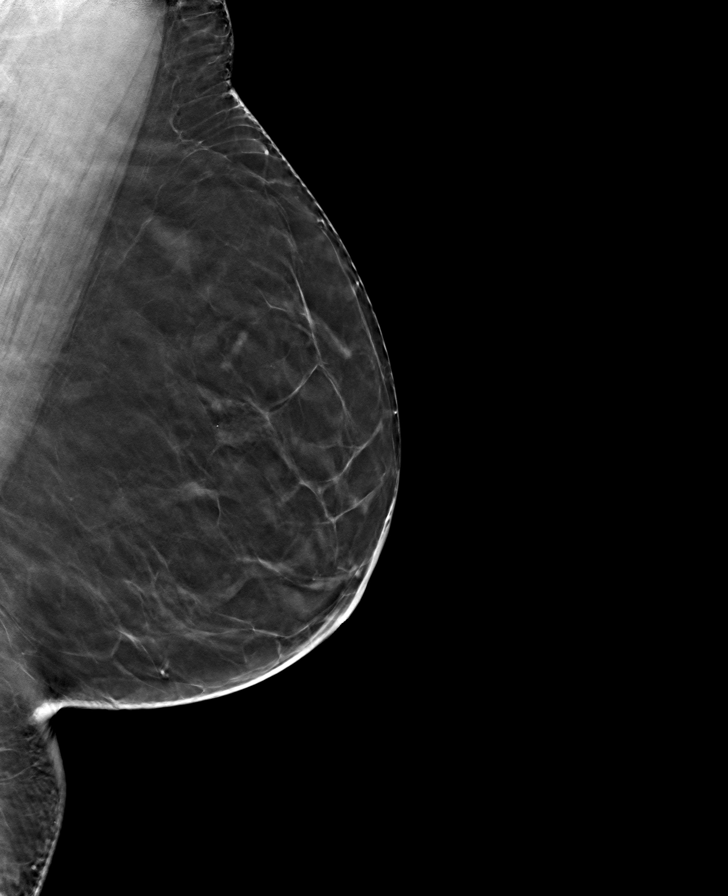

[L CC tomo · tomo slice 35/68.0]
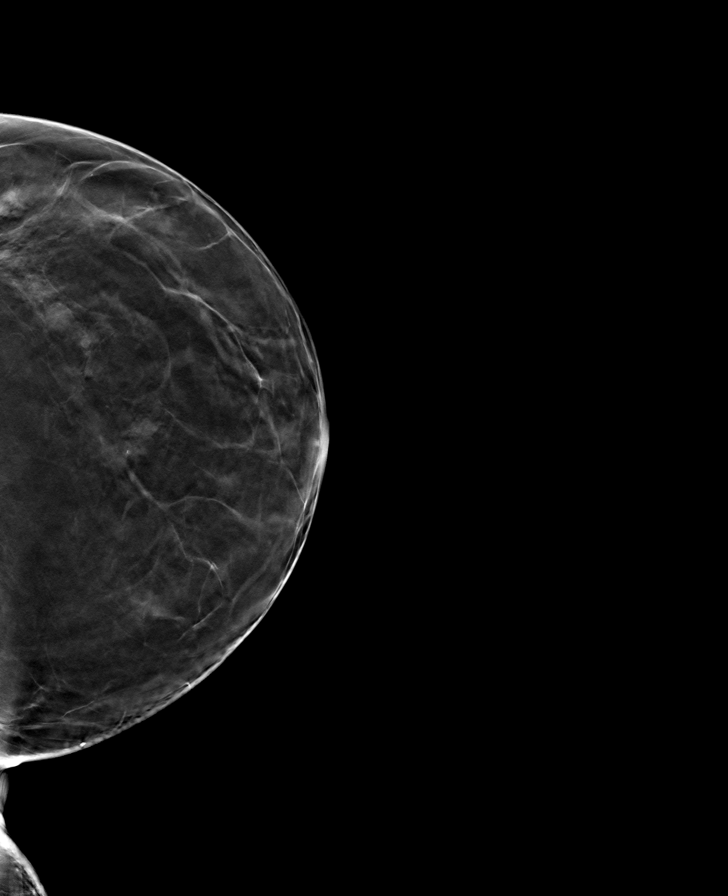

[R CC tomo · tomo slice 35/69.0]
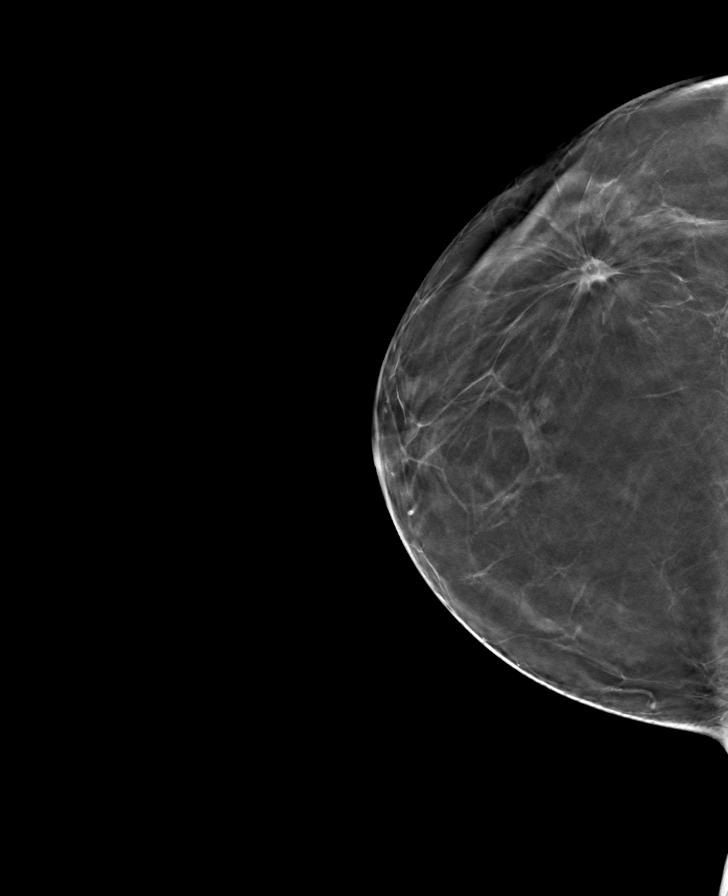

[8 of 24 positions shown; findings below may reference images not displayed]

ACR Breast Density Category b: There are scattered areas of
fibroglandular density.
FINDINGS: There are no findings suspicious for malignancy. Images were
processed with CAD.
IMPRESSION: No mammographic evidence of malignancy. A result letter of this
screening mammogram will be mailed directly to the patient.

RECOMMENDATION:
Screening mammogram in one year. (Code:[id number]-775)

BI-RADS CATEGORY  1: Negative.

## 2021-11-12 ENCOUNTER — Telehealth: Payer: Self-pay

## 2021-11-12 NOTE — Telephone Encounter (Signed)
Spoke to pt and asked if still using Saxenda and she advised that she didn't at moment and advised that I didn't need to redo the PA for it now

## 2021-11-16 ENCOUNTER — Other Ambulatory Visit: Payer: Self-pay | Admitting: Nurse Practitioner

## 2021-11-16 ENCOUNTER — Other Ambulatory Visit: Payer: Self-pay | Admitting: Internal Medicine

## 2022-01-06 ENCOUNTER — Other Ambulatory Visit: Payer: Self-pay

## 2022-01-06 ENCOUNTER — Other Ambulatory Visit: Payer: Self-pay | Admitting: Nurse Practitioner

## 2022-01-06 DIAGNOSIS — I10 Essential (primary) hypertension: Secondary | ICD-10-CM

## 2022-01-06 MED ORDER — GABAPENTIN 300 MG PO CAPS: 300.0000 mg | ORAL_CAPSULE | Freq: Three times a day (TID) | ORAL | 0 refills | Status: DC

## 2022-01-06 MED ORDER — METOPROLOL TARTRATE 50 MG PO TABS: 50.0000 mg | ORAL_TABLET | Freq: Two times a day (BID) | ORAL | 0 refills | Status: DC

## 2022-01-22 ENCOUNTER — Other Ambulatory Visit: Payer: Self-pay

## 2022-01-22 ENCOUNTER — Emergency Department
Admission: EM | Admit: 2022-01-22 | Discharge: 2022-01-22 | Disposition: A | Discharge status: Home / Self Care | Payer: Managed Care, Other (non HMO) | Attending: Emergency Medicine | Admitting: Emergency Medicine

## 2022-01-22 ENCOUNTER — Emergency Department: Payer: Managed Care, Other (non HMO)

## 2022-01-22 ENCOUNTER — Ambulatory Visit: Payer: No Typology Code available for payment source | Admitting: Nurse Practitioner

## 2022-01-22 ENCOUNTER — Encounter: Payer: Self-pay | Admitting: Intensive Care

## 2022-01-22 DIAGNOSIS — I10 Essential (primary) hypertension: Secondary | ICD-10-CM | POA: Insufficient documentation

## 2022-01-22 DIAGNOSIS — S82852A Displaced trimalleolar fracture of left lower leg, initial encounter for closed fracture: Secondary | ICD-10-CM | POA: Insufficient documentation

## 2022-01-22 DIAGNOSIS — X509XXA Other and unspecified overexertion or strenuous movements or postures, initial encounter: Secondary | ICD-10-CM | POA: Insufficient documentation

## 2022-01-22 DIAGNOSIS — J449 Chronic obstructive pulmonary disease, unspecified: Secondary | ICD-10-CM | POA: Insufficient documentation

## 2022-01-22 HISTORY — DX: Scoliosis, unspecified: M41.9

## 2022-01-22 MED ORDER — HYDROCODONE-ACETAMINOPHEN 5-325 MG PO TABS: 1.0000 | ORAL_TABLET | ORAL | 0 refills | Status: AC | PRN

## 2022-01-22 MED ORDER — HYDROCODONE-ACETAMINOPHEN 5-325 MG PO TABS
1.0000 | ORAL_TABLET | Freq: Once | ORAL | Status: AC
  Administered 2022-01-22: 1 via ORAL
  Filled 2022-01-22: qty 1

## 2022-01-22 NOTE — ED Triage Notes (Signed)
Patient presents with left ankle injury ?

## 2022-01-22 NOTE — Discharge Instructions (Addendum)
-  Take ibuprofen as needed for pain.  Utilize hydrocodone/acetaminophen sparingly. ? ?-Follow-up with the orthopedist listed above next week. ? ?-Avoid bearing any weight on the affected foot. ? ?-Return to the emergency department anytime if you begin to experience any new or worsening symptoms. ?

## 2022-01-22 NOTE — ED Notes (Signed)
Patient verbalized discharge understanding  

## 2022-01-22 NOTE — ED Provider Notes (Signed)
? ?[facility] ?Provider Note ? ? ? Event Date/Time  ? First MD Initiated Contact with Patient 01/22/22 1647   ?  (approximate) ? ? ?History  ? ?Chief Complaint ?Ankle Pain (left) ? ? ?HPI ?Patricia Le is a 66 y.o. female, history of hypertension, GERD, COPD, hyperlipidemia, presents to the emergency department for evaluation of ankle pain.  She states that she was outside today when she rolled on some pine cones, causing her left ankle to roll and collapse.  She states that she has not been able to walk on her own since due to the pain in her ankle.  Denies tibia/fibula pain, knee pain, thigh pain, hip pain, numbness/tingling in lower extremity, cold sensation, fever/chills, head injury, LOC, abdominal pain, or nausea/vomiting. ? ?History Limitations: No limitations. ? ?    ? ? ?Physical Exam  ?Triage Vital Signs: ?ED Triage Vitals [01/22/22 1638]  ?Enc Vitals Group  ?   BP (!) 134/98  ?   Pulse Rate 93  ?   Resp 16  ?   Temp 98.8 ?F (37.1 ?C)  ?   Temp Source Oral  ?   SpO2 98 %  ?   Weight 184 lb (83.5 kg)  ?   Height '4\' 11"'$  (1.499 m)  ?   Head Circumference   ?   Peak Flow   ?   Pain Score 9  ?   Pain Loc   ?   Pain Edu?   ?   Excl. in [place]?   ? ? ?Most recent vital signs: ?Vitals:  ? 01/22/22 1638  ?BP: (!) 134/98  ?Pulse: 93  ?Resp: 16  ?Temp: 98.8 ?F (37.1 ?C)  ?SpO2: 98%  ? ? ?General: Awake, NAD.  ?Skin: Warm, dry. No rashes or lesions.  ?Eyes: PERRL. Conjunctivae normal.  ?CV: Good peripheral perfusion.  ?Resp: Normal effort.  ?Abd: Soft, non-tender. No distention.  ?Neuro: At baseline. No gross neurological deficits.  ? ?Focused Exam: Diffuse swelling along the left ankle.  Patient is able to flex and extend at the ankle joint, as well as with the toes.  Pulse, motor, sensation intact distally.  No surrounding warmth or erythema.  No bony tenderness along the midshaft or proximal tibia/fibula.  No tenderness or swelling along the knee joint or femur. ? ?Physical Exam ? ? ? ?ED  Results / Procedures / Treatments  ?Labs ?(all labs ordered are listed, but only abnormal results are displayed) ?Labs Reviewed - No data to display ? ? ?EKG ?Not applicable.  ? ? ?RADIOLOGY ? ?ED Provider Interpretation: I personally reviewed and interpreted this x-ray, trimalleolar fracture present left ankle. ? ?DG Ankle Complete Left ? ?Result Date: 01/22/2022 ?CLINICAL DATA:  Slipped and fell.  Ankle pain and swelling. EXAM: LEFT ANKLE COMPLETE - 3+ VIEW COMPARISON:  None Available. FINDINGS: Trimalleolar fracture. Transverse fracture of the medial malleolus. Mildly comminuted fracture of the distal fibular shaft. Fracture of the posterior lip of the tibia. Moderate widening of the ankle mortise. IMPRESSION: Trimalleolar fracture with moderate widening of the ankle mortise. Electronically Signed   By: Nelson Chimes M.D.   On: 01/22/2022 17:16   ? ?PROCEDURES: ? ?Critical Care performed: None. ? ?Procedures ? ? ? ?MEDICATIONS ORDERED IN ED: ?Medications  ?HYDROcodone-acetaminophen (NORCO/VICODIN) 5-325 MG per tablet 1 tablet (1 tablet Oral Given 01/22/22 1743)  ? ? ? ?IMPRESSION / MDM / ASSESSMENT AND PLAN / ED COURSE  ?I reviewed the triage vital signs and the nursing  notes. ?             ?               ? ?Differential diagnosis includes, but is not limited to, medial malleolus fracture, lateral malleolus fracture, ankle sprain, tarsal fracture, metatarsal fracture ? ?ED Course ?Patient appears well, vitals are within normal limits.  NAD.  We will go ahead and ice the foot and treat with hydrocodone/acetaminophen. ? ?Assessment/Plan ?Patient presents with ankle injury following mechanical fall.  X-ray shows trimalleolar fracture with moderate widening of the mortise.  Neurovascularly intact.  Splinted with combination of sugar-tong and posterior short.  Spoke with Dr. Sharlet Salina, who stated that he will follow-up with the patient in clinic prior to surgery.  We will plan discharge with a prescription for  hydrocodone/acetaminophen and crutches/walker. ? ?Provided the patient with anticipatory guidance, return precautions, and educational material. Encouraged the patient to return to the emergency department at any time if they begin to experience any new or worsening symptoms. Patient expressed understanding and agreed with the plan.  ? ?  ? ? ?FINAL CLINICAL IMPRESSION(S) / ED DIAGNOSES  ? ?Final diagnoses:  ?Closed trimalleolar fracture of left ankle, initial encounter  ? ? ? ?Rx / DC Orders  ? ?ED Discharge Orders   ? ?      Ordered  ?  HYDROcodone-acetaminophen (NORCO/VICODIN) 5-325 MG tablet  Every 4 hours PRN       ? 01/22/22 1853  ? ?  ?  ? ?  ? ? ? ?Note:  This document was prepared using Dragon voice recognition software and may include unintentional dictation errors. ?  ?Teodoro Spray, [place] ?01/22/22 1919 ? ?  ?Blake Divine, MD ?01/22/22 1937 ? ?

## 2022-01-25 ENCOUNTER — Ambulatory Visit (INDEPENDENT_AMBULATORY_CARE_PROVIDER_SITE_OTHER): Payer: No Typology Code available for payment source | Admitting: Nurse Practitioner

## 2022-01-25 ENCOUNTER — Encounter: Payer: Self-pay | Admitting: Nurse Practitioner

## 2022-01-25 VITALS — BP 109/80 | HR 94 | Temp 98.0°F | Resp 16 | Ht 59.0 in | Wt 184.0 lb

## 2022-01-25 DIAGNOSIS — I10 Essential (primary) hypertension: Secondary | ICD-10-CM | POA: Diagnosis not present

## 2022-01-25 DIAGNOSIS — R7303 Prediabetes: Secondary | ICD-10-CM

## 2022-01-25 DIAGNOSIS — S82852S Displaced trimalleolar fracture of left lower leg, sequela: Secondary | ICD-10-CM

## 2022-01-25 MED ORDER — METOPROLOL TARTRATE 50 MG PO TABS: ORAL_TABLET | ORAL | 3 refills | Status: DC

## 2022-01-25 MED ORDER — HYDROXYZINE HCL 10 MG PO TABS: 10.0000 mg | ORAL_TABLET | Freq: Three times a day (TID) | ORAL | 0 refills | Status: DC | PRN

## 2022-01-25 MED ORDER — GABAPENTIN 300 MG PO CAPS: 300.0000 mg | ORAL_CAPSULE | Freq: Three times a day (TID) | ORAL | 3 refills | Status: DC

## 2022-01-25 NOTE — Progress Notes (Signed)
[facility] [place], [place] [address]  Internal MEDICINE  Office Visit Note  Patient Name: Patricia Le  [id number]  [id number]  Date of Service: 01/25/2022  Chief Complaint  Patient presents with   Medication Refill    HPI Patricia Le presents for follow-up visit for prediabetes as, hypertension and medication refills.  Also patient had an accident and fell on Friday, May 5 and now has a closed trimalleolar fracture of the left ankle.  This was confirmed on imaging and ER on Friday last week.  She was instructed to follow-up with Dr. Renee Harder at [facility] in [place] but she has not received a call yet nor has she called them since so she is also wanting a referral to hopefully expedite the process of having her seen.  Her pain is adequately controlled at this time and patient will call if she needs additional pain medication.  She is also going on a short trip to [place] on Thursday to watch her granddaughter in a recital.  She is nonweightbearing on the left ankle and utilizing a walker for ambulation.    Current Medication: Outpatient Encounter Medications as of 01/25/2022  Medication Sig   albuterol (VENTOLIN HFA) 108 (90 Base) MCG/ACT inhaler Inhale 2 puffs into the lungs 4 (four) times daily as needed for wheezing or shortness of breath.   budesonide-formoterol (SYMBICORT) 80-4.5 MCG/ACT inhaler Inhale 2 puffs into the lungs daily as needed.   fluticasone (FLONASE) 50 MCG/ACT nasal spray 2 SPRAYS IN BOTH NOSTRILS EVERY DAY FOR RHINITIS   [EXPIRED] HYDROcodone-acetaminophen (NORCO/VICODIN) 5-325 MG tablet Take 1-2 tablets by mouth every 4 (four) hours as needed for up to 10 days for moderate pain.   Liraglutide -Weight Management (SAXENDA) 18 MG/3ML SOPN Inject 3 mg into the skin daily.   meloxicam (MOBIC) 7.5 MG tablet TAKE 1 TABLET BY MOUTH AS  NEEDED FOR PAIN   Tiotropium Bromide Monohydrate (SPIRIVA RESPIMAT) 2.5 MCG/ACT AERS Inhale 2  puffs into the lungs daily at 6 (six) AM.   triamcinolone cream (KENALOG) 0.1 % Apply to all affected areas twice daily as needed   [DISCONTINUED] gabapentin (NEURONTIN) 300 MG capsule Take 1 capsule (300 mg total) by mouth 3 (three) times daily.   [DISCONTINUED] hydrOXYzine (ATARAX) 10 MG tablet Take 1 tablet (10 mg total) by mouth 3 (three) times daily as needed for itching.   [DISCONTINUED] metoprolol tartrate (LOPRESSOR) 50 MG tablet TAKE 1 TABLET(50 MG) BY MOUTH TWICE DAILY   gabapentin (NEURONTIN) 300 MG capsule Take 1 capsule (300 mg total) by mouth 3 (three) times daily.   metoprolol tartrate (LOPRESSOR) 50 MG tablet TAKE 1 TABLET(50 MG) BY MOUTH TWICE DAILY   No facility-administered encounter medications on file as of 01/25/2022.    Surgical History: Past Surgical History:  Procedure Laterality Date   ABDOMINAL HYSTERECTOMY     BACK SURGERY     BREAST BIOPSY Right 2015   DCIS   BREAST LUMPECTOMY Right 2015   DCIS with Mammosite   BREAST MAMMOSITE Right 07-02-14   BREAST SURGERY Right 06-06-14   Wide excision of Right breast DCIS   CESAREAN SECTION     CHOLECYSTECTOMY     COLONOSCOPY     over 10 yrs ago/ Dr Kristopher Glee   COLONOSCOPY  11-20-14   Dr Jamal Collin hyperplastic polyp   HIATAL HERNIA REPAIR  04/2019   TONSILLECTOMY     TUBAL LIGATION      Medical History: Past Medical History:  Diagnosis Date  Anemia    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; 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%, 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%   Cancer (HCC)    DCIS right breast   Colon polyp    GERD (gastroesophageal reflux disease)    Hiatal hernia    Hyperlipidemia    Hypertension    Personal history of radiation therapy    Restless leg syndrome    Scoliosis     Family History: Family History  Problem Relation Age of Onset   Diabetes Mother    Cancer Father 60       melanoma   Breast cancer Neg Hx     Social History   Socioeconomic History   Marital status: Divorced    Spouse name: Not on file   Number of  children: Not on file   Years of education: Not on file   Highest education level: Not on file  Occupational History   Not on file  Tobacco Use   Smoking status: Former    Packs/day: 1.00    Years: 20.00    Total pack years: 20.00    Types: Cigarettes   Smokeless tobacco: Never  Substance and Sexual Activity   Alcohol use: Yes    Alcohol/week: 0.0 standard drinks of alcohol    Comment: occasionally   Drug use: Never   Sexual activity: Not on file  Other Topics Concern   Not on file  Social History Narrative   Not on file   Social Determinants of Health   Financial Resource Strain: Not on file  Food Insecurity: Not on file  Transportation Needs: Not on file  Physical Activity: Not on file  Stress: Not on file  Social Connections: Not on file  Intimate Partner Violence: Not on file      Review of Systems  Constitutional:  Negative for chills, fatigue and unexpected weight change.  HENT:  Negative for congestion, rhinorrhea, sneezing and sore throat.   Eyes:  Negative for redness.  Respiratory:  Negative for cough, chest tightness and shortness of breath.   Cardiovascular:  Negative for chest pain and palpitations.  Gastrointestinal:  Negative for abdominal pain, constipation, diarrhea, nausea and vomiting.  Genitourinary:  Negative for dysuria and frequency.  Musculoskeletal:  Negative for arthralgias, back pain, joint swelling and neck pain.  Skin:  Negative for rash.  Neurological: Negative.  Negative for tremors and numbness.  Hematological:  Negative for adenopathy. Does not bruise/bleed easily.  Psychiatric/Behavioral:  Negative for behavioral problems (Depression), self-injury, sleep disturbance and suicidal ideas. The patient is nervous/anxious.     Vital Signs: BP 109/80   Pulse 94   Temp 98 F (36.7 C)   Resp 16   Ht '4\' 11"'$  (1.499 m)   Wt 184 lb (83.5 kg)   SpO2 98%   BMI 37.16 kg/m    Physical Exam Vitals reviewed.  Constitutional:       General: She is not in acute distress.    Appearance: Normal appearance. She is obese. She is not ill-appearing.  HENT:     Head: Normocephalic and atraumatic.  Eyes:     Extraocular Movements: Extraocular movements intact.     Pupils: Pupils are equal, round, and reactive to light.  Cardiovascular:     Rate and Rhythm: Normal rate and regular rhythm.  Pulmonary:     Effort: Pulmonary effort is normal.     Breath sounds: Normal breath sounds.  Neurological:     Mental Status: She is alert and oriented  to person, place, and time.     Cranial Nerves: No cranial nerve deficit.     Coordination: Coordination normal.     Gait: Gait normal.  Psychiatric:        Mood and Affect: Mood normal.        Behavior: Behavior normal.        Assessment/Plan: 1. Essential hypertension Stable, refills ordered - metoprolol tartrate (LOPRESSOR) 50 MG tablet; TAKE 1 TABLET(50 MG) BY MOUTH TWICE DAILY  Dispense: 180 tablet; Refill: 3  2. Prediabetes Stable, will repeat A1c at next office visit  3. Closed trimalleolar fracture of left ankle, sequela Referral to ortho ordered.  - gabapentin (NEURONTIN) 300 MG capsule; Take 1 capsule (300 mg total) by mouth 3 (three) times daily.  Dispense: 270 capsule; Refill: 3 - Ambulatory referral to Orthopedic Surgery   General Counseling: Nanea verbalizes understanding of the findings of todays visit and agrees with plan of treatment. I have discussed any further diagnostic evaluation that may be needed or ordered today. We also reviewed her medications today. she has been encouraged to call the office with any questions or concerns that should arise related to todays visit.    Orders Placed This Encounter  Procedures   Ambulatory referral to Orthopedic Surgery    Meds ordered this encounter  Medications   gabapentin (NEURONTIN) 300 MG capsule    Sig: Take 1 capsule (300 mg total) by mouth 3 (three) times daily.    Dispense:  270 capsule    Refill:   3    Requesting 1 year supply   metoprolol tartrate (LOPRESSOR) 50 MG tablet    Sig: TAKE 1 TABLET(50 MG) BY MOUTH TWICE DAILY    Dispense:  180 tablet    Refill:  3    **Patient requests 90 days supply**   DISCONTD: hydrOXYzine (ATARAX) 10 MG tablet    Sig: Take 1 tablet (10 mg total) by mouth 3 (three) times daily as needed for itching.    Dispense:  90 tablet    Refill:  0    Return in about 3 months (around 04/27/2022) for F/U, Recheck A1C, Essa Wenk PCP.   Total time spent:30 Minutes Time spent includes review of chart, medications, test results, and follow up plan with the patient.   [place] Controlled Substance Database was reviewed by me.  This patient was seen by Jonetta Osgood, FNP-C in collaboration with Dr. Clayborn Bigness as a part of collaborative care agreement.   Allyne Hebert R. Valetta Fuller, MSN, FNP-C Internal medicine

## 2022-01-27 ENCOUNTER — Telehealth: Payer: Self-pay

## 2022-01-27 NOTE — Telephone Encounter (Signed)
Referral sent via [facility] to [facility] ?

## 2022-01-27 NOTE — Telephone Encounter (Signed)
Appointment>> 01/27/22-Toni ?

## 2022-02-18 ENCOUNTER — Other Ambulatory Visit: Payer: Self-pay | Admitting: Nurse Practitioner

## 2022-03-20 ENCOUNTER — Encounter: Payer: Self-pay | Admitting: Nurse Practitioner

## 2022-05-20 ENCOUNTER — Encounter: Payer: Self-pay | Admitting: Physician Assistant

## 2022-05-20 ENCOUNTER — Telehealth (INDEPENDENT_AMBULATORY_CARE_PROVIDER_SITE_OTHER): Payer: No Typology Code available for payment source | Admitting: Physician Assistant

## 2022-05-20 ENCOUNTER — Other Ambulatory Visit: Payer: Self-pay

## 2022-05-20 VITALS — Ht 59.0 in | Wt 184.0 lb

## 2022-05-20 DIAGNOSIS — I10 Essential (primary) hypertension: Secondary | ICD-10-CM

## 2022-05-20 DIAGNOSIS — S82852S Displaced trimalleolar fracture of left lower leg, sequela: Secondary | ICD-10-CM

## 2022-05-20 DIAGNOSIS — J069 Acute upper respiratory infection, unspecified: Secondary | ICD-10-CM

## 2022-05-20 MED ORDER — AZITHROMYCIN 250 MG PO TABS: ORAL_TABLET | ORAL | 0 refills | Status: DC

## 2022-05-20 MED ORDER — PREDNISONE 10 MG PO TABS: ORAL_TABLET | ORAL | 0 refills | Status: DC

## 2022-05-20 MED ORDER — METOPROLOL TARTRATE 50 MG PO TABS: ORAL_TABLET | ORAL | 3 refills | Status: DC

## 2022-05-20 MED ORDER — GABAPENTIN 300 MG PO CAPS: 300.0000 mg | ORAL_CAPSULE | Freq: Three times a day (TID) | ORAL | 3 refills | Status: DC

## 2022-05-20 MED ORDER — ALBUTEROL SULFATE HFA 108 (90 BASE) MCG/ACT IN AERS: 2.0000 | INHALATION_SPRAY | Freq: Four times a day (QID) | RESPIRATORY_TRACT | 3 refills | Status: AC | PRN

## 2022-05-20 NOTE — Progress Notes (Signed)
[facility] [place], [place] [address]  Internal MEDICINE  Telephone Visit  Patient Name: Patricia Le  [id number]  [id number]  Date of Service: 05/20/2022  I connected with the patient at 2:25 by telephone and verified the patients identity using two identifiers.   I discussed the limitations, risks, security and privacy concerns of performing an evaluation and management service by telephone and the availability of in person appointments. I also discussed with the patient that there may be a patient responsible charge related to the service.  The patient expressed understanding and agrees to proceed.    Chief Complaint  Patient presents with   Telephone Assessment    [id number]   Telephone Screen   Cough    Yellow mucus going on for 2 weeks     HPI Pt is here for virtual sick visit -Started having symptoms about 2 weeks ago -Coughing with yellow phlegm -A little wheezing, but no SOB and no sinus congestion. Reports it all seems to be in her chest. -Taking dayquil and nyquil. -Doesn't have her inhalers because she is not home and living with family right now, but reports she hasnt needed her inhalers in awhile. Will send albuterol now just in case  Current Medication: Outpatient Encounter Medications as of 05/20/2022  Medication Sig   azithromycin (ZITHROMAX) 250 MG tablet Take one tab a day for 10 days for uri   budesonide-formoterol (SYMBICORT) 80-4.5 MCG/ACT inhaler Inhale 2 puffs into the lungs daily as needed.   fluticasone (FLONASE) 50 MCG/ACT nasal spray 2 SPRAYS IN BOTH NOSTRILS EVERY DAY FOR RHINITIS   gabapentin (NEURONTIN) 300 MG capsule Take 1 capsule (300 mg total) by mouth 3 (three) times daily.   hydrOXYzine (ATARAX) 10 MG tablet TAKE 1 TABLET BY MOUTH 3 TIMES DAILY AS NEEDED FOR ITCHING.   Liraglutide -Weight Management (SAXENDA) 18 MG/3ML SOPN Inject 3 mg into the skin daily.   meloxicam (MOBIC) 7.5 MG tablet TAKE 1 TABLET BY MOUTH AS   NEEDED FOR PAIN   metoprolol tartrate (LOPRESSOR) 50 MG tablet TAKE 1 TABLET(50 MG) BY MOUTH TWICE DAILY   predniSONE (DELTASONE) 10 MG tablet Take one tab 3 x day for 3 days, then take one tab 2 x a day for 3 days and then take one tab a day for 3 days for copd   Tiotropium Bromide Monohydrate (SPIRIVA RESPIMAT) 2.5 MCG/ACT AERS Inhale 2 puffs into the lungs daily at 6 (six) AM.   triamcinolone cream (KENALOG) 0.1 % Apply to all affected areas twice daily as needed   [DISCONTINUED] albuterol (VENTOLIN HFA) 108 (90 Base) MCG/ACT inhaler Inhale 2 puffs into the lungs 4 (four) times daily as needed for wheezing or shortness of breath.   albuterol (VENTOLIN HFA) 108 (90 Base) MCG/ACT inhaler Inhale 2 puffs into the lungs 4 (four) times daily as needed for wheezing or shortness of breath.   No facility-administered encounter medications on file as of 05/20/2022.    Surgical History: Past Surgical History:  Procedure Laterality Date   ABDOMINAL HYSTERECTOMY     BACK SURGERY     BREAST BIOPSY Right 2015   DCIS   BREAST LUMPECTOMY Right 2015   DCIS with Mammosite   BREAST MAMMOSITE Right 07-02-14   BREAST SURGERY Right 06-06-14   Wide excision of Right breast DCIS   CESAREAN SECTION     CHOLECYSTECTOMY     COLONOSCOPY     over 10 yrs ago/ Dr Kristopher Glee   COLONOSCOPY  11-20-14   Dr Jamal Collin hyperplastic polyp   HIATAL HERNIA REPAIR  04/2019   TONSILLECTOMY     TUBAL LIGATION      Medical History: Past Medical History:  Diagnosis Date   Anemia    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; 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%, 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-15%   Cancer (HCC)    DCIS right breast   Colon polyp    GERD (gastroesophageal reflux disease)    Hiatal hernia    Hyperlipidemia    Hypertension    Personal history of radiation therapy    Restless leg syndrome    Scoliosis     Family History: Family History  Problem Relation Age of Onset   Diabetes Mother    Cancer Father 62       melanoma   Breast cancer Neg  Hx     Social History   Socioeconomic History   Marital status: Divorced    Spouse name: Not on file   Number of children: Not on file   Years of education: Not on file   Highest education level: Not on file  Occupational History   Not on file  Tobacco Use   Smoking status: Former    Packs/day: 1.00    Years: 20.00    Total pack years: 20.00    Types: Cigarettes   Smokeless tobacco: Never  Substance and Sexual Activity   Alcohol use: Yes    Alcohol/week: 0.0 standard drinks of alcohol    Comment: occasionally   Drug use: Never   Sexual activity: Not on file  Other Topics Concern   Not on file  Social History Narrative   Not on file   Social Determinants of Health   Financial Resource Strain: Not on file  Food Insecurity: Not on file  Transportation Needs: Not on file  Physical Activity: Not on file  Stress: Not on file  Social Connections: Not on file  Intimate Partner Violence: Not on file      Review of Systems  Constitutional:  Positive for fatigue. Negative for fever.  HENT:  Positive for postnasal drip. Negative for congestion and mouth sores.   Respiratory:  Positive for cough and wheezing. Negative for shortness of breath.   Cardiovascular:  Negative for chest pain.  Genitourinary:  Negative for flank pain.  Psychiatric/Behavioral: Negative.      Vital Signs: Ht '4\' 11"'$  (1.499 m)   Wt 184 lb (83.5 kg)   BMI 37.16 kg/m    Observation/Objective:  Pt is able to carry out conversation  Assessment/Plan: 1. Upper respiratory tract infection, unspecified type Will start on zpak and prednisone and may use albuterol as needed. Advised to rest and stay well hydrated. - azithromycin (ZITHROMAX) 250 MG tablet; Take one tab a day for 10 days for uri  Dispense: 10 tablet; Refill: 0 - predniSONE (DELTASONE) 10 MG tablet; Take one tab 3 x day for 3 days, then take one tab 2 x a day for 3 days and then take one tab a day for 3 days for copd  Dispense: 18  tablet; Refill: 0 - albuterol (VENTOLIN HFA) 108 (90 Base) MCG/ACT inhaler; Inhale 2 puffs into the lungs 4 (four) times daily as needed for wheezing or shortness of breath.  Dispense: 15 g; Refill: 3   General Counseling: Patricia Le verbalizes understanding of the findings of today's phone visit and agrees with plan of treatment. I have discussed any further diagnostic evaluation that may be needed or ordered  today. We also reviewed her medications today. she has been encouraged to call the office with any questions or concerns that should arise related to todays visit.    No orders of the defined types were placed in this encounter.   Meds ordered this encounter  Medications   azithromycin (ZITHROMAX) 250 MG tablet    Sig: Take one tab a day for 10 days for uri    Dispense:  10 tablet    Refill:  0   predniSONE (DELTASONE) 10 MG tablet    Sig: Take one tab 3 x day for 3 days, then take one tab 2 x a day for 3 days and then take one tab a day for 3 days for copd    Dispense:  18 tablet    Refill:  0   albuterol (VENTOLIN HFA) 108 (90 Base) MCG/ACT inhaler    Sig: Inhale 2 puffs into the lungs 4 (four) times daily as needed for wheezing or shortness of breath.    Dispense:  15 g    Refill:  3    Time spent:25 Minutes    Dr Lavera Guise Internal medicine

## 2022-06-28 ENCOUNTER — Telehealth: Payer: Self-pay

## 2022-06-28 NOTE — Telephone Encounter (Signed)
Pt called that she had Covid positive last Monday pt went to the urgent care they told her OTC she is feeling better more drainage and coughing and as per dr Humphrey Rolls  advised her to take OTC Claritin and Mucinex if not feeling better need appt

## 2022-07-09 ENCOUNTER — Encounter: Payer: No Typology Code available for payment source | Admitting: Nurse Practitioner

## 2022-07-22 ENCOUNTER — Other Ambulatory Visit: Payer: Self-pay | Admitting: Nurse Practitioner

## 2022-07-22 DIAGNOSIS — Z1231 Encounter for screening mammogram for malignant neoplasm of breast: Secondary | ICD-10-CM

## 2022-07-30 ENCOUNTER — Encounter: Payer: No Typology Code available for payment source | Admitting: Nurse Practitioner

## 2022-08-02 ENCOUNTER — Encounter: Payer: Self-pay | Admitting: Nurse Practitioner

## 2022-08-02 ENCOUNTER — Ambulatory Visit (INDEPENDENT_AMBULATORY_CARE_PROVIDER_SITE_OTHER): Payer: No Typology Code available for payment source | Admitting: Nurse Practitioner

## 2022-08-02 VITALS — BP 120/78 | HR 70 | Temp 97.9°F | Resp 16 | Ht 59.0 in | Wt 183.2 lb

## 2022-08-02 DIAGNOSIS — E782 Mixed hyperlipidemia: Secondary | ICD-10-CM

## 2022-08-02 DIAGNOSIS — Z0001 Encounter for general adult medical examination with abnormal findings: Secondary | ICD-10-CM | POA: Diagnosis not present

## 2022-08-02 DIAGNOSIS — E559 Vitamin D deficiency, unspecified: Secondary | ICD-10-CM

## 2022-08-02 DIAGNOSIS — Z23 Encounter for immunization: Secondary | ICD-10-CM

## 2022-08-02 DIAGNOSIS — I10 Essential (primary) hypertension: Secondary | ICD-10-CM

## 2022-08-02 NOTE — Progress Notes (Signed)
[facility] [place], [place] [address]  Internal MEDICINE  Office Visit Note  Patient Name: Patricia Le  [id number]  [id number]  Date of Service: 08/02/2022  Chief Complaint  Patient presents with   Annual Exam   Gastroesophageal Reflux   Hypertension   Hyperlipidemia    HPI Patricia Le presents for an annual well visit and physical exam.  Well-appearing 66 year old female with hypertension, GERD, and high cholesterol. --Mammogram scheduled for 08/24/22 --discuss pap in 2025 --DEXA scan for next year --request flu shot.  --BP and other vital signs are normal --due for routine labs now    Current Medication: Outpatient Encounter Medications as of 08/02/2022  Medication Sig   albuterol (VENTOLIN HFA) 108 (90 Base) MCG/ACT inhaler Inhale 2 puffs into the lungs 4 (four) times daily as needed for wheezing or shortness of breath.   fluticasone (FLONASE) 50 MCG/ACT nasal spray 2 SPRAYS IN BOTH NOSTRILS EVERY DAY FOR RHINITIS   gabapentin (NEURONTIN) 300 MG capsule Take 1 capsule (300 mg total) by mouth 3 (three) times daily.   hydrOXYzine (ATARAX) 10 MG tablet TAKE 1 TABLET BY MOUTH 3 TIMES DAILY AS NEEDED FOR ITCHING.   meloxicam (MOBIC) 7.5 MG tablet TAKE 1 TABLET BY MOUTH AS  NEEDED FOR PAIN   metoprolol tartrate (LOPRESSOR) 50 MG tablet TAKE 1 TABLET(50 MG) BY MOUTH TWICE DAILY   triamcinolone cream (KENALOG) 0.1 % Apply to all affected areas twice daily as needed   [DISCONTINUED] azithromycin (ZITHROMAX) 250 MG tablet Take one tab a day for 10 days for uri   [DISCONTINUED] budesonide-formoterol (SYMBICORT) 80-4.5 MCG/ACT inhaler Inhale 2 puffs into the lungs daily as needed.   [DISCONTINUED] Liraglutide -Weight Management (SAXENDA) 18 MG/3ML SOPN Inject 3 mg into the skin daily.   [DISCONTINUED] predniSONE (DELTASONE) 10 MG tablet Take one tab 3 x day for 3 days, then take one tab 2 x a day for 3 days and then take one tab a day for 3 days for copd    [DISCONTINUED] Tiotropium Bromide Monohydrate (SPIRIVA RESPIMAT) 2.5 MCG/ACT AERS Inhale 2 puffs into the lungs daily at 6 (six) AM.   No facility-administered encounter medications on file as of 08/02/2022.    Surgical History: Past Surgical History:  Procedure Laterality Date   ABDOMINAL HYSTERECTOMY     BACK SURGERY     BREAST BIOPSY Right 2015   DCIS   BREAST LUMPECTOMY Right 2015   DCIS with Mammosite   BREAST MAMMOSITE Right 07-02-14   BREAST SURGERY Right 06-06-14   Wide excision of Right breast DCIS   CESAREAN SECTION     CHOLECYSTECTOMY     COLONOSCOPY     over 10 yrs ago/ Dr Kristopher Glee   COLONOSCOPY  11-20-14   Dr Jamal Collin hyperplastic polyp   HIATAL HERNIA REPAIR  04/2019   TONSILLECTOMY     TUBAL LIGATION      Medical History: Past Medical History:  Diagnosis Date   Anemia    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; 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%, 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%   Cancer (HCC)    DCIS right breast   Colon polyp    GERD (gastroesophageal reflux disease)    Hiatal hernia    Hyperlipidemia    Hypertension    Personal history of radiation therapy    Restless leg syndrome    Scoliosis     Family History: Family History  Problem Relation Age of Onset   Diabetes Mother  Cancer Father 56       melanoma   Breast cancer Neg Hx     Social History   Socioeconomic History   Marital status: Divorced    Spouse name: Not on file   Number of children: Not on file   Years of education: Not on file   Highest education level: Not on file  Occupational History   Not on file  Tobacco Use   Smoking status: Former    Packs/day: 1.00    Years: 20.00    Total pack years: 20.00    Types: Cigarettes   Smokeless tobacco: Never  Substance and Sexual Activity   Alcohol use: Yes    Alcohol/week: 0.0 standard drinks of alcohol    Comment: occasionally   Drug use: Never   Sexual activity: Not on file  Other Topics Concern   Not on file  Social History Narrative   Not on file    Social Determinants of Health   Financial Resource Strain: Not on file  Food Insecurity: Not on file  Transportation Needs: Not on file  Physical Activity: Not on file  Stress: Not on file  Social Connections: Not on file  Intimate Partner Violence: Not on file      Review of Systems  Constitutional:  Negative for activity change, appetite change, chills, fatigue, fever and unexpected weight change.  HENT: Negative.  Negative for congestion, ear pain, rhinorrhea, sore throat and trouble swallowing.   Eyes: Negative.   Respiratory: Negative.  Negative for cough, chest tightness, shortness of breath and wheezing.   Cardiovascular: Negative.  Negative for chest pain.  Gastrointestinal: Negative.  Negative for abdominal pain, blood in stool, constipation, diarrhea, nausea and vomiting.  Endocrine: Negative.   Genitourinary: Negative.  Negative for difficulty urinating, dysuria, frequency, hematuria and urgency.  Musculoskeletal: Negative.  Negative for arthralgias, back pain, joint swelling, myalgias and neck pain.  Skin: Negative.  Negative for rash and wound.  Allergic/Immunologic: Negative.  Negative for immunocompromised state.  Neurological: Negative.  Negative for dizziness, seizures, numbness and headaches.  Hematological: Negative.   Psychiatric/Behavioral: Negative.  Negative for behavioral problems, self-injury and suicidal ideas. The patient is not nervous/anxious.     Vital Signs: BP 120/78   Pulse 70   Temp 97.9 F (36.6 C)   Resp 16   Ht _0  (1.499 m)   Wt 183 lb 3.2 oz (83.1 kg)   SpO2 98%   BMI 37.00 kg/m    Physical Exam Vitals reviewed.  Constitutional:      General: She is awake. She is not in acute distress.    Appearance: Normal appearance. She is well-developed. She is obese. She is not ill-appearing or diaphoretic.  HENT:     Head: Normocephalic and atraumatic.     Right Ear: Tympanic membrane, ear canal and external ear normal.     Left  Ear: Tympanic membrane, ear canal and external ear normal.     Nose: Nose normal. No congestion or rhinorrhea.     Mouth/Throat:     Lips: Pink.     Mouth: Mucous membranes are moist.     Pharynx: Oropharynx is clear. Uvula midline. No oropharyngeal exudate or posterior oropharyngeal erythema.  Eyes:     General: Lids are normal. Vision grossly intact. Gaze aligned appropriately. No scleral icterus.       Right eye: No discharge.        Left eye: No discharge.     Extraocular Movements: Extraocular movements  intact.     Conjunctiva/sclera: Conjunctivae normal.     Pupils: Pupils are equal, round, and reactive to light.     Funduscopic exam:    Right eye: Red reflex present.        Left eye: Red reflex present. Neck:     Thyroid: No thyromegaly.     Vascular: No JVD.     Trachea: Trachea and phonation normal. No tracheal deviation.  Cardiovascular:     Rate and Rhythm: Normal rate and regular rhythm.     Pulses: Normal pulses.     Heart sounds: Normal heart sounds, S1 normal and S2 normal. No murmur heard.    No friction rub. No gallop.  Pulmonary:     Effort: Pulmonary effort is normal. No accessory muscle usage or respiratory distress.     Breath sounds: Normal breath sounds and air entry. No stridor. No wheezing or rales.  Chest:     Chest wall: No tenderness.  Breasts:    Right: Normal. No swelling, bleeding, inverted nipple, mass, nipple discharge, skin change or tenderness.     Left: Normal. No swelling, bleeding, inverted nipple, mass, nipple discharge, skin change or tenderness.  Abdominal:     General: Bowel sounds are normal. There is no distension.     Palpations: Abdomen is soft. There is no shifting dullness, fluid wave, mass or pulsatile mass.     Tenderness: There is no abdominal tenderness. There is no guarding or rebound.  Genitourinary:    Exam position: Lithotomy position.  Musculoskeletal:        General: No tenderness or deformity. Normal range of motion.      Cervical back: Normal range of motion and neck supple.  Lymphadenopathy:     Cervical: No cervical adenopathy.     Upper Body:     Right upper body: No supraclavicular, axillary or pectoral adenopathy.     Left upper body: No supraclavicular, axillary or pectoral adenopathy.  Skin:    General: Skin is warm and dry.     Capillary Refill: Capillary refill takes less than 2 seconds.     Coloration: Skin is not pale.     Findings: No erythema or rash.  Neurological:     Mental Status: She is alert and oriented to person, place, and time.     Cranial Nerves: No cranial nerve deficit.     Motor: No abnormal muscle tone.     Coordination: Coordination normal.     Gait: Gait normal.     Deep Tendon Reflexes: Reflexes are normal and symmetric.  Psychiatric:        Mood and Affect: Mood and affect normal.        Behavior: Behavior normal. Behavior is cooperative.        Thought Content: Thought content normal.        Judgment: Judgment normal.        Assessment/Plan: 1. Encounter for routine adult health examination with abnormal findings Age-appropriate preventive screenings and vaccinations discussed, annual physical exam completed. Routine labs for health maintenance ordered, see below. PHM updated.  - CBC with Differential/Platelet - CMP14+EGFR - Lipid Profile - Vitamin D (25 hydroxy)  2. Essential hypertension Stable, continue medications as prescribed  3. Vitamin D deficiency Routine lab ordered - Vitamin D (25 hydroxy)  4. Mixed hyperlipidemia Routine labs ordered - CMP14+EGFR - Lipid Profile  5. Needs flu shot Administered in office today - Flu Vaccine MDCK QUAD PF  General Counseling: Ashritha verbalizes understanding of the findings of todays visit and agrees with plan of treatment. I have discussed any further diagnostic evaluation that may be needed or ordered today. We also reviewed her medications today. she has been encouraged to call the office  with any questions or concerns that should arise related to todays visit.    Orders Placed This Encounter  Procedures   Flu Vaccine MDCK QUAD PF   CBC with Differential/Platelet   CMP14+EGFR   Lipid Profile   Vitamin D (25 hydroxy)    No orders of the defined types were placed in this encounter.   Return in about 6 months (around 01/31/2023) for F/U, med refill, Marveen Donlon PCP.   Total time spent:30 Minutes Time spent includes review of chart, medications, test results, and follow up plan with the patient.   [place] Controlled Substance Database was reviewed by me.  This patient was seen by Jonetta Osgood, FNP-C in collaboration with Dr. Clayborn Bigness as a part of collaborative care agreement.  Maureena Dabbs R. Valetta Fuller, MSN, FNP-C Internal medicine

## 2022-08-10 LAB — CBC WITH DIFFERENTIAL/PLATELET
Basophils Absolute: 0.1 10*3/uL (ref 0.0–0.2)
Basos: 1 %
EOS (ABSOLUTE): 0.1 10*3/uL (ref 0.0–0.4)
Eos: 2 %
Hematocrit: 40.7 % (ref 34.0–46.6)
Hemoglobin: 13.9 g/dL (ref 11.1–15.9)
Immature Grans (Abs): 0.1 10*3/uL (ref 0.0–0.1)
Immature Granulocytes: 1 %
Lymphocytes Absolute: 2.3 10*3/uL (ref 0.7–3.1)
Lymphs: 40 %
MCH: 30.3 pg (ref 26.6–33.0)
MCHC: 34.2 g/dL (ref 31.5–35.7)
MCV: 89 fL (ref 79–97)
Monocytes Absolute: 0.5 10*3/uL (ref 0.1–0.9)
Monocytes: 9 %
Neutrophils Absolute: 2.6 10*3/uL (ref 1.4–7.0)
Neutrophils: 47 %
Platelets: 242 10*3/uL (ref 150–450)
RBC: 4.59 x10E6/uL (ref 3.77–5.28)
RDW: 12.9 % (ref 11.7–15.4)
WBC: 5.6 10*3/uL (ref 3.4–10.8)

## 2022-08-10 LAB — CMP14+EGFR
ALT: 22 IU/L (ref 0–32)
AST: 21 IU/L (ref 0–40)
Albumin/Globulin Ratio: 2 (ref 1.2–2.2)
Albumin: 4.5 g/dL (ref 3.9–4.9)
Alkaline Phosphatase: 79 IU/L (ref 44–121)
BUN/Creatinine Ratio: 15 (ref 12–28)
BUN: 10 mg/dL (ref 8–27)
Bilirubin Total: 0.6 mg/dL (ref 0.0–1.2)
CO2: 26 mmol/L (ref 20–29)
Calcium: 9.6 mg/dL (ref 8.7–10.3)
Chloride: 100 mmol/L (ref 96–106)
Creatinine, Ser: 0.66 mg/dL (ref 0.57–1.00)
Globulin, Total: 2.3 g/dL (ref 1.5–4.5)
Glucose: 103 mg/dL — ABNORMAL HIGH (ref 70–99)
Potassium: 4.6 mmol/L (ref 3.5–5.2)
Sodium: 137 mmol/L (ref 134–144)
Total Protein: 6.8 g/dL (ref 6.0–8.5)
eGFR: 98 mL/min/{1.73_m2} (ref >59)

## 2022-08-10 LAB — LIPID PANEL
Chol/HDL Ratio: 4.5 ratio — ABNORMAL HIGH (ref 0.0–4.4)
Cholesterol, Total: 220 mg/dL — ABNORMAL HIGH (ref 100–199)
HDL: 49 mg/dL (ref >39)
LDL Chol Calc (NIH): 139 mg/dL — ABNORMAL HIGH (ref 0–99)
Triglycerides: 179 mg/dL — ABNORMAL HIGH (ref 0–149)
VLDL Cholesterol Cal: 32 mg/dL (ref 5–40)

## 2022-08-10 LAB — VITAMIN D 25 HYDROXY (VIT D DEFICIENCY, FRACTURES): Vit D, 25-Hydroxy: 67.8 ng/mL (ref 30.0–100.0)

## 2022-08-24 ENCOUNTER — Ambulatory Visit
Admission: RE | Admit: 2022-08-24 | Discharge: 2022-08-24 | Disposition: A | Discharge status: Home / Self Care | Payer: No Typology Code available for payment source | Source: Ambulatory Visit | Attending: Nurse Practitioner | Admitting: Nurse Practitioner

## 2022-08-24 DIAGNOSIS — Z1231 Encounter for screening mammogram for malignant neoplasm of breast: Secondary | ICD-10-CM | POA: Insufficient documentation

## 2022-08-27 NOTE — Progress Notes (Signed)
Labs are normal except for elevated cholesterol has increased more.  Please make sure patient has prudent diet. May take OTC fish oil or flaxseed oil supplement.

## 2022-08-30 ENCOUNTER — Telehealth: Payer: Self-pay

## 2022-08-30 NOTE — Telephone Encounter (Signed)
Patient notified

## 2022-08-30 NOTE — Telephone Encounter (Signed)
-----   Message from Jonetta Osgood, NP sent at 08/27/2022  3:02 PM EST ----- Labs are normal except for elevated cholesterol has increased more.  Please make sure patient has prudent diet. May take OTC fish oil or flaxseed oil supplement.

## 2022-11-26 ENCOUNTER — Telehealth: Payer: Self-pay

## 2022-11-26 NOTE — Telephone Encounter (Signed)
Mychart msg sent

## 2022-12-27 ENCOUNTER — Encounter: Payer: Self-pay | Admitting: Podiatry

## 2022-12-27 ENCOUNTER — Ambulatory Visit: Payer: Medicare HMO | Admitting: Podiatry

## 2022-12-27 VITALS — BP 127/85 | HR 67

## 2022-12-27 DIAGNOSIS — B351 Tinea unguium: Secondary | ICD-10-CM

## 2022-12-27 MED ORDER — TERBINAFINE HCL 250 MG PO TABS: 250.0000 mg | ORAL_TABLET | Freq: Every day | ORAL | 0 refills | Status: AC

## 2022-12-28 ENCOUNTER — Telehealth: Payer: Self-pay | Admitting: Podiatry

## 2022-12-28 NOTE — Progress Notes (Signed)
  Subjective:  Patient ID: Patricia Le, female    DOB: Sep 09, 1956,  MRN: [id number]  Chief Complaint  Patient presents with   Nail Problem    "My toe, it's real sore." N - ingrown toenail L - right hallux  D - 2 weeks O - gradually worse C - throbbing, tender, fungus A - mash down on it T  - dug in it and tried to cut it out    67 y.o. female presents with the above complaint. History confirmed with patient.   Objective:  Physical Exam: warm, good capillary refill, no trophic changes or ulcerative lesions, normal DP and PT pulses, normal sensory exam, and right hallux nail with incurvated borders distally, dystrophy of growth and discoloration.      Assessment:   1. Onychomycosis      Plan:  Patient was evaluated and treated and all questions answered.  Onychomycosis -Educated on etiology of nail fungus. -Discussed oral topical and laser therapy and risks and benefits of each -eRx for oral terbinafine #90. Educated on risks and benefits of the medication. -Photographs taken -Culture of nail plate sent to lab -We discussed most of the ingrowing nails due to the dystrophy and thickening of the nail plate of the distal quarter.  Discussed removal of the nail and permanent or temporary total or partial fashion.  Pain was alleviated with a slant back debridement today, follow-up sooner for nail removal if not improving but expect this will resolve with treatment of the onychomycosis  Return in about 16 weeks (around 04/18/2023) for follow up after nail fungus treatment.

## 2023-01-04 ENCOUNTER — Ambulatory Visit: Payer: Self-pay | Admitting: Podiatry

## 2023-01-27 ENCOUNTER — Ambulatory Visit (INDEPENDENT_AMBULATORY_CARE_PROVIDER_SITE_OTHER): Payer: Medicare HMO | Admitting: Nurse Practitioner

## 2023-01-27 ENCOUNTER — Encounter: Payer: Self-pay | Admitting: Nurse Practitioner

## 2023-01-27 VITALS — BP 120/80 | HR 66 | Temp 97.8°F | Resp 16 | Ht 59.0 in | Wt 181.0 lb

## 2023-01-27 DIAGNOSIS — M25572 Pain in left ankle and joints of left foot: Secondary | ICD-10-CM

## 2023-01-27 DIAGNOSIS — Z5181 Encounter for therapeutic drug level monitoring: Secondary | ICD-10-CM | POA: Diagnosis not present

## 2023-01-27 DIAGNOSIS — I10 Essential (primary) hypertension: Secondary | ICD-10-CM | POA: Diagnosis not present

## 2023-01-27 DIAGNOSIS — G8929 Other chronic pain: Secondary | ICD-10-CM

## 2023-01-27 DIAGNOSIS — R7303 Prediabetes: Secondary | ICD-10-CM

## 2023-01-27 DIAGNOSIS — B351 Tinea unguium: Secondary | ICD-10-CM | POA: Diagnosis not present

## 2023-01-27 MED ORDER — METOPROLOL TARTRATE 50 MG PO TABS: ORAL_TABLET | ORAL | 3 refills | Status: AC

## 2023-01-27 MED ORDER — GABAPENTIN 300 MG PO CAPS: 300.0000 mg | ORAL_CAPSULE | Freq: Three times a day (TID) | ORAL | 3 refills | Status: AC

## 2023-01-27 MED ORDER — MELOXICAM 7.5 MG PO TABS: ORAL_TABLET | ORAL | 3 refills | Status: AC

## 2023-01-27 NOTE — Progress Notes (Signed)
[facility] [address] [place], [place] [address]  Internal MEDICINE  Office Visit Note  Patient Name: Patricia Le  [id number]  [id number]  Date of Service: 01/27/2023  Chief Complaint  Patient presents with   Gastroesophageal Reflux   Hypertension   Hyperlipidemia   Follow-up    HPI Patricia Le presents for a follow-up visit for gas pains, labs and prediabetes.  Having more gas pains, improved since earlier this morning. Gas-x helps.  Onychomycosis -- being treated by podiatry with terbinafine Prediabetes -- A1c was 6.0 in 2022.     Current Medication: Outpatient Encounter Medications as of 01/27/2023  Medication Sig   albuterol (VENTOLIN HFA) 108 (90 Base) MCG/ACT inhaler Inhale 2 puffs into the lungs 4 (four) times daily as needed for wheezing or shortness of breath.   fluticasone (FLONASE) 50 MCG/ACT nasal spray 2 SPRAYS IN BOTH NOSTRILS EVERY DAY FOR RHINITIS   terbinafine (LAMISIL) 250 MG tablet Take 1 tablet (250 mg total) by mouth daily.   [DISCONTINUED] gabapentin (NEURONTIN) 300 MG capsule Take 1 capsule (300 mg total) by mouth 3 (three) times daily.   [DISCONTINUED] hydrOXYzine (ATARAX) 10 MG tablet TAKE 1 TABLET BY MOUTH 3 TIMES DAILY AS NEEDED FOR ITCHING.   [DISCONTINUED] meloxicam (MOBIC) 7.5 MG tablet TAKE 1 TABLET BY MOUTH AS  NEEDED FOR PAIN   [DISCONTINUED] metoprolol tartrate (LOPRESSOR) 50 MG tablet TAKE 1 TABLET(50 MG) BY MOUTH TWICE DAILY   [DISCONTINUED] triamcinolone cream (KENALOG) 0.1 % Apply to all affected areas twice daily as needed   gabapentin (NEURONTIN) 300 MG capsule Take 1 capsule (300 mg total) by mouth 3 (three) times daily.   meloxicam (MOBIC) 7.5 MG tablet TAKE 1 TABLET BY MOUTH AS  NEEDED FOR PAIN   metoprolol tartrate (LOPRESSOR) 50 MG tablet TAKE 1 TABLET(50 MG) BY MOUTH TWICE DAILY   No facility-administered encounter medications on file as of 01/27/2023.    Surgical History: Past Surgical History:  Procedure Laterality  Date   ABDOMINAL HYSTERECTOMY     BACK SURGERY     BREAST BIOPSY Right 2015   DCIS   BREAST LUMPECTOMY Right 2015   DCIS with Mammosite   BREAST MAMMOSITE Right 07-02-14   BREAST SURGERY Right 06-06-14   Wide excision of Right breast DCIS   CESAREAN SECTION     CHOLECYSTECTOMY     COLONOSCOPY     over 10 yrs ago/ Dr Nils Flack   COLONOSCOPY  11-20-14   Dr Evette Cristal hyperplastic polyp   HIATAL HERNIA REPAIR  04/2019   TONSILLECTOMY     TUBAL LIGATION      Medical History: Past Medical History:  Diagnosis Date   Anemia    Breast cancer (HCC) 2015   9 mm intermediate grade DCIS; 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%, 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-15%   Cancer (HCC)    DCIS right breast   Colon polyp    GERD (gastroesophageal reflux disease)    Hiatal hernia    Hyperlipidemia    Hypertension    Personal history of radiation therapy    Restless leg syndrome    Scoliosis     Family History: Family History  Problem Relation Age of Onset   Diabetes Mother    Cancer Father 82       melanoma   Breast cancer Neg Hx     Social History   Socioeconomic History   Marital status: Divorced    Spouse name: Not on file   Number of children: Not on file   Years  of education: Not on file   Highest education level: Not on file  Occupational History   Not on file  Tobacco Use   Smoking status: Former    Packs/day: 1.00    Years: 20.00    Additional pack years: 0.00    Total pack years: 20.00    Types: Cigarettes   Smokeless tobacco: Never  Substance and Sexual Activity   Alcohol use: Not Currently    Comment: occasionally   Drug use: Never   Sexual activity: Not on file  Other Topics Concern   Not on file  Social History Narrative   Not on file   Social Determinants of Health   Financial Resource Strain: Not on file  Food Insecurity: Not on file  Transportation Needs: Not on file  Physical Activity: Not on file  Stress: Not on file  Social Connections: Not on file  Intimate Partner Violence: Not on file       Review of Systems  Constitutional:  Negative for chills, fatigue and unexpected weight change.  HENT:  Negative for congestion, rhinorrhea, sneezing and sore throat.   Eyes:  Negative for redness.  Respiratory:  Negative for cough, chest tightness and shortness of breath.   Cardiovascular:  Negative for chest pain and palpitations.  Gastrointestinal:  Negative for abdominal pain, constipation, diarrhea, nausea and vomiting.  Genitourinary:  Negative for dysuria and frequency.  Musculoskeletal:  Negative for arthralgias, back pain, joint swelling and neck pain.  Skin:  Negative for rash.  Neurological: Negative.  Negative for tremors and numbness.  Hematological:  Negative for adenopathy. Does not bruise/bleed easily.  Psychiatric/Behavioral:  Negative for behavioral problems (Depression), self-injury, sleep disturbance and suicidal ideas. The patient is nervous/anxious.     Vital Signs: BP 120/80   Pulse 66   Temp 97.8 F (36.6 C)   Resp 16   Ht 4\' 11"  (1.499 m)   Wt 181 lb (82.1 kg)   SpO2 95%   BMI 36.56 kg/m    Physical Exam Vitals reviewed.  Constitutional:      General: She is not in acute distress.    Appearance: Normal appearance. She is obese. She is not ill-appearing.  HENT:     Head: Normocephalic and atraumatic.  Eyes:     Extraocular Movements: Extraocular movements intact.     Pupils: Pupils are equal, round, and reactive to light.  Cardiovascular:     Rate and Rhythm: Normal rate and regular rhythm.  Pulmonary:     Effort: Pulmonary effort is normal.     Breath sounds: Normal breath sounds.  Neurological:     Mental Status: She is alert and oriented to person, place, and time.     Cranial Nerves: No cranial nerve deficit.     Coordination: Coordination normal.     Gait: Gait normal.  Psychiatric:        Mood and Affect: Mood normal.        Behavior: Behavior normal.        Assessment/Plan: 1. Prediabetes Repeat labs ordered - Hgb A1C  w/o eAG - Hepatic function panel  2. Essential hypertension Continue metoprolol as prescribed.  - metoprolol tartrate (LOPRESSOR) 50 MG tablet; TAKE 1 TABLET(50 MG) BY MOUTH TWICE DAILY  Dispense: 180 tablet; Refill: 3  3. Chronic pain of left ankle Continue gabapentin and meloxicam as prescribed. - gabapentin (NEURONTIN) 300 MG capsule; Take 1 capsule (300 mg total) by mouth 3 (three) times daily.  Dispense: 270 capsule; Refill: 3 -  meloxicam (MOBIC) 7.5 MG tablet; TAKE 1 TABLET BY MOUTH AS  NEEDED FOR PAIN  Dispense: 90 tablet; Refill: 3  4. Onychomycosis Treated with terbinafine by podiatry  5. Therapeutic drug monitoring Liver function panel ordered to check levels since she is taking terbinafine - Hepatic function panel   General Counseling: Fidencia verbalizes understanding of the findings of todays visit and agrees with plan of treatment. I have discussed any further diagnostic evaluation that may be needed or ordered today. We also reviewed her medications today. she has been encouraged to call the office with any questions or concerns that should arise related to todays visit.    Orders Placed This Encounter  Procedures   Hgb A1C w/o eAG   Hepatic function panel    Meds ordered this encounter  Medications   gabapentin (NEURONTIN) 300 MG capsule    Sig: Take 1 capsule (300 mg total) by mouth 3 (three) times daily.    Dispense:  270 capsule    Refill:  3    Requesting 1 year supply   meloxicam (MOBIC) 7.5 MG tablet    Sig: TAKE 1 TABLET BY MOUTH AS  NEEDED FOR PAIN    Dispense:  90 tablet    Refill:  3    Do not fill unless patient calls for the medication   metoprolol tartrate (LOPRESSOR) 50 MG tablet    Sig: TAKE 1 TABLET(50 MG) BY MOUTH TWICE DAILY    Dispense:  180 tablet    Refill:  3    **Patient requests 90 days supply**    Return for previously scheduled, CPE, Tajh Livsey PCP in november..   Total time spent:30 Minutes Time spent includes review of chart,  medications, test results, and follow up plan with the patient.   [place] Controlled Substance Database was reviewed by me.  This patient was seen by Sallyanne Kuster, FNP-C in collaboration with Dr. Beverely Risen as a part of collaborative care agreement.   Celes Dedic R. Tedd Sias, MSN, FNP-C Internal medicine

## 2023-02-17 DIAGNOSIS — R7303 Prediabetes: Secondary | ICD-10-CM | POA: Diagnosis not present

## 2023-02-17 DIAGNOSIS — Z5181 Encounter for therapeutic drug level monitoring: Secondary | ICD-10-CM | POA: Diagnosis not present

## 2023-02-18 LAB — HEPATIC FUNCTION PANEL
ALT: 27 IU/L (ref 0–32)
AST: 24 IU/L (ref 0–40)
Albumin: 4.6 g/dL (ref 3.9–4.9)
Alkaline Phosphatase: 77 IU/L (ref 44–121)
Bilirubin Total: 0.4 mg/dL (ref 0.0–1.2)
Bilirubin, Direct: 0.12 mg/dL (ref 0.00–0.40)
Total Protein: 6.8 g/dL (ref 6.0–8.5)

## 2023-02-18 LAB — HGB A1C W/O EAG: Hgb A1c MFr Bld: 6.2 % — ABNORMAL HIGH (ref 4.8–5.6)

## 2023-03-11 NOTE — Progress Notes (Signed)
A1c stable at 6.2  Liver function panel is normal

## 2023-03-14 ENCOUNTER — Telehealth: Payer: Self-pay

## 2023-03-14 NOTE — Telephone Encounter (Signed)
-----   Message from Sallyanne Kuster, NP sent at 03/11/2023  2:01 PM EDT ----- A1c stable at 6.2  Liver function panel is normal

## 2023-03-14 NOTE — Telephone Encounter (Signed)
Patient notified

## 2023-04-01 ENCOUNTER — Other Ambulatory Visit: Payer: Self-pay | Admitting: Internal Medicine

## 2023-04-01 DIAGNOSIS — Z1231 Encounter for screening mammogram for malignant neoplasm of breast: Secondary | ICD-10-CM

## 2023-04-13 ENCOUNTER — Ambulatory Visit: Payer: Medicare HMO | Admitting: Podiatry

## 2023-04-13 ENCOUNTER — Encounter: Payer: Self-pay | Admitting: Podiatry

## 2023-04-13 VITALS — BP 112/74 | HR 57

## 2023-04-13 DIAGNOSIS — B351 Tinea unguium: Secondary | ICD-10-CM

## 2023-04-13 MED ORDER — TERBINAFINE HCL 250 MG PO TABS: 250.0000 mg | ORAL_TABLET | Freq: Every day | ORAL | 0 refills | Status: AC

## 2023-04-13 NOTE — Progress Notes (Signed)
  Subjective:  Patient ID: Patricia Le, female    DOB: September 16, 1956,  MRN: [id number]  Chief Complaint  Patient presents with   Nail Problem    "It's better and the blackness is starting to clear up."    67 y.o. female presents with the above complaint. History confirmed with patient.  She had no problems taking the Lamisil, no side effects  Objective:  Physical Exam: warm, good capillary refill, no trophic changes or ulcerative lesions, normal DP and PT pulses, normal sensory exam, and right hallux onychomycosis with 50% clearance.  Assessment:   1. Onychomycosis      Plan:  Patient was evaluated and treated and all questions answered.    so far responding well to terbinafine treatment, has about 50% clearance.  No adverse side effects.  I recommended 1 more course of this and refill sent to pharmacy.  Follow-up as needed.  Return if symptoms worsen or fail to improve.

## 2023-05-18 ENCOUNTER — Telehealth: Payer: Self-pay | Admitting: Nurse Practitioner

## 2023-05-18 NOTE — Telephone Encounter (Signed)
Lvm to pt to see if pt is ours

## 2023-08-03 ENCOUNTER — Telehealth: Payer: Self-pay | Admitting: Nurse Practitioner

## 2023-08-03 NOTE — Telephone Encounter (Signed)
Left vm and sent mychart message to confirm 08/08/23 appointment-Toni

## 2023-08-04 ENCOUNTER — Telehealth: Payer: Self-pay | Admitting: Internal Medicine

## 2023-08-04 NOTE — Telephone Encounter (Signed)
Per patient, she changed pcp. Self-discharge-tw

## 2023-08-08 ENCOUNTER — Ambulatory Visit: Payer: No Typology Code available for payment source | Admitting: Nurse Practitioner

## 2023-08-29 ENCOUNTER — Ambulatory Visit
Admission: RE | Admit: 2023-08-29 | Discharge: 2023-08-29 | Disposition: A | Discharge status: Home / Self Care | Payer: Medicare HMO | Source: Ambulatory Visit | Attending: Internal Medicine | Admitting: Internal Medicine

## 2023-08-29 DIAGNOSIS — Z1231 Encounter for screening mammogram for malignant neoplasm of breast: Secondary | ICD-10-CM | POA: Insufficient documentation

## 2024-06-26 ENCOUNTER — Ambulatory Visit
Admission: RE | Admit: 2024-06-26 | Discharge: 2024-06-26 | Disposition: A | Discharge status: Home / Self Care | Attending: Internal Medicine | Admitting: Internal Medicine

## 2024-06-26 ENCOUNTER — Ambulatory Visit
Admission: RE | Admit: 2024-06-26 | Discharge: 2024-06-26 | Disposition: A | Discharge status: Home / Self Care | Source: Ambulatory Visit | Attending: Internal Medicine | Admitting: Internal Medicine

## 2024-06-26 ENCOUNTER — Other Ambulatory Visit: Payer: Self-pay | Admitting: Internal Medicine

## 2024-06-26 DIAGNOSIS — R1084 Generalized abdominal pain: Secondary | ICD-10-CM

## 2024-08-06 ENCOUNTER — Other Ambulatory Visit: Payer: Self-pay | Admitting: Internal Medicine

## 2024-08-06 DIAGNOSIS — Z1231 Encounter for screening mammogram for malignant neoplasm of breast: Secondary | ICD-10-CM

## 2024-09-10 ENCOUNTER — Ambulatory Visit
Admission: RE | Admit: 2024-09-10 | Discharge: 2024-09-10 | Disposition: A | Discharge status: Home / Self Care | Source: Ambulatory Visit | Attending: Internal Medicine | Admitting: Internal Medicine

## 2024-09-10 DIAGNOSIS — Z1231 Encounter for screening mammogram for malignant neoplasm of breast: Secondary | ICD-10-CM | POA: Insufficient documentation
